# Patient Record
Sex: Female | Born: 1955 | Race: Black or African American | Hispanic: No | Marital: Married
Health system: Southern US, Academic
[De-identification: ages and names within clinical notes are randomized; demographics above are authoritative.]

## PROBLEM LIST (undated history)

## (undated) ENCOUNTER — Ambulatory Visit: Attending: Internal Medicine | Primary: Internal Medicine

## (undated) ENCOUNTER — Ambulatory Visit

## (undated) ENCOUNTER — Telehealth

## (undated) ENCOUNTER — Encounter

## (undated) ENCOUNTER — Ambulatory Visit: Payer: MEDICARE

## (undated) ENCOUNTER — Ambulatory Visit: Payer: Medicare (Managed Care) | Attending: Podiatrist | Primary: Podiatrist

## (undated) ENCOUNTER — Encounter: Attending: Pharmacist | Primary: Pharmacist

## (undated) ENCOUNTER — Encounter: Attending: Ambulatory Care | Primary: Ambulatory Care

## (undated) ENCOUNTER — Other Ambulatory Visit

## (undated) ENCOUNTER — Encounter: Attending: Surgery | Primary: Surgery

## (undated) ENCOUNTER — Ambulatory Visit: Payer: Medicare (Managed Care) | Attending: Family | Primary: Family

## (undated) ENCOUNTER — Ambulatory Visit: Payer: Medicare (Managed Care)

## (undated) ENCOUNTER — Ambulatory Visit
Payer: Medicare (Managed Care) | Attending: Student in an Organized Health Care Education/Training Program | Primary: Student in an Organized Health Care Education/Training Program

## (undated) ENCOUNTER — Ambulatory Visit: Attending: Dermatology | Primary: Dermatology

## (undated) ENCOUNTER — Telehealth: Attending: Surgery | Primary: Surgery

## (undated) ENCOUNTER — Encounter: Attending: Pediatrics | Primary: Pediatrics

## (undated) ENCOUNTER — Encounter: Attending: Diagnostic Radiology | Primary: Diagnostic Radiology

## (undated) ENCOUNTER — Ambulatory Visit: Payer: MEDICARE | Attending: Internal Medicine | Primary: Internal Medicine

## (undated) ENCOUNTER — Ambulatory Visit
Attending: Rehabilitative and Restorative Service Providers" | Primary: Rehabilitative and Restorative Service Providers"

## (undated) ENCOUNTER — Ambulatory Visit: Attending: Pharmacist | Primary: Pharmacist

## (undated) ENCOUNTER — Telehealth: Attending: Dermatology | Primary: Dermatology

## (undated) ENCOUNTER — Encounter: Attending: Internal Medicine | Primary: Internal Medicine

## (undated) ENCOUNTER — Encounter: Attending: Podiatrist | Primary: Podiatrist

## (undated) DIAGNOSIS — E119 Type 2 diabetes mellitus without complications: Secondary | ICD-10-CM

## (undated) MED ORDER — BROMFENAC 0.075 % EYE DROPS: OPHTHALMIC | 0 days

---
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
## 2004-08-22 ENCOUNTER — Emergency Department: Payer: Self-pay | Admitting: Emergency Medicine

## 2009-08-14 ENCOUNTER — Emergency Department: Payer: Self-pay | Admitting: Emergency Medicine

## 2010-04-10 ENCOUNTER — Emergency Department: Payer: Self-pay | Admitting: Emergency Medicine

## 2013-07-01 ENCOUNTER — Emergency Department: Payer: Self-pay | Admitting: Emergency Medicine

## 2016-07-25 ENCOUNTER — Encounter: Payer: Self-pay | Admitting: Emergency Medicine

## 2016-07-25 DIAGNOSIS — R600 Localized edema: Secondary | ICD-10-CM | POA: Insufficient documentation

## 2016-07-25 DIAGNOSIS — Z7984 Long term (current) use of oral hypoglycemic drugs: Secondary | ICD-10-CM | POA: Insufficient documentation

## 2016-07-25 DIAGNOSIS — E119 Type 2 diabetes mellitus without complications: Secondary | ICD-10-CM | POA: Insufficient documentation

## 2016-07-25 NOTE — ED Triage Notes (Signed)
Pt states that she started noticing swelling to her legs bilaterally yesterday. Pt denies hx of CHF or previous cardiac hx. Pt denies pain at his time but does have +1 edema bilaterally to both legs. Pt is ambulatory to triage with NAD noted at this time.

## 2016-07-26 ENCOUNTER — Emergency Department: Payer: Self-pay

## 2016-07-26 ENCOUNTER — Emergency Department
Admission: EM | Admit: 2016-07-26 | Discharge: 2016-07-26 | Disposition: A | Discharge status: Home / Self Care | Payer: Self-pay | Attending: Emergency Medicine | Admitting: Emergency Medicine

## 2016-07-26 DIAGNOSIS — R609 Edema, unspecified: Secondary | ICD-10-CM

## 2016-07-26 HISTORY — DX: Type 2 diabetes mellitus without complications: E11.9

## 2016-07-26 LAB — BASIC METABOLIC PANEL
Anion gap: 9 (ref 5–15)
BUN: 15 mg/dL (ref 6–20)
CHLORIDE: 99 mmol/L — AB (ref 101–111)
CO2: 25 mmol/L (ref 22–32)
CREATININE: 0.54 mg/dL (ref 0.44–1.00)
Calcium: 9.4 mg/dL (ref 8.9–10.3)
Glucose, Bld: 157 mg/dL — ABNORMAL HIGH (ref 65–99)
Potassium: 3.8 mmol/L (ref 3.5–5.1)
SODIUM: 133 mmol/L — AB (ref 135–145)

## 2016-07-26 LAB — CBC
HCT: 31.5 % — ABNORMAL LOW (ref 35.0–47.0)
HEMOGLOBIN: 11 g/dL — AB (ref 12.0–16.0)
MCH: 32.2 pg (ref 26.0–34.0)
MCHC: 34.9 g/dL (ref 32.0–36.0)
MCV: 92.1 fL (ref 80.0–100.0)
PLATELETS: 259 10*3/uL (ref 150–440)
RBC: 3.42 MIL/uL — AB (ref 3.80–5.20)
RDW: 13.4 % (ref 11.5–14.5)
WBC: 5 10*3/uL (ref 3.6–11.0)

## 2016-07-26 LAB — TROPONIN I

## 2016-07-26 LAB — BRAIN NATRIURETIC PEPTIDE: B NATRIURETIC PEPTIDE 5: 21 pg/mL (ref 0.0–100.0)

## 2016-07-26 MED ORDER — OXYCODONE-ACETAMINOPHEN 5-325 MG PO TABS
1.0000 | ORAL_TABLET | Freq: Once | ORAL | Status: AC
  Administered 2016-07-26: 1 via ORAL

## 2016-07-26 MED ORDER — FUROSEMIDE 20 MG PO TABS: 20.0000 mg | ORAL_TABLET | Freq: Every day | ORAL | 0 refills | Status: AC

## 2016-07-26 MED ORDER — FUROSEMIDE 40 MG PO TABS
20.0000 mg | ORAL_TABLET | Freq: Once | ORAL | Status: AC
  Administered 2016-07-26: 20 mg via ORAL

## 2016-07-26 MED ORDER — OXYCODONE-ACETAMINOPHEN 5-325 MG PO TABS
ORAL_TABLET | ORAL | Status: AC
  Filled 2016-07-26: qty 1

## 2016-07-26 MED ORDER — FUROSEMIDE 40 MG PO TABS
ORAL_TABLET | ORAL | Status: DC
Start: 2016-07-26 — End: 2016-07-26
  Filled 2016-07-26: qty 1

## 2016-07-26 NOTE — ED Notes (Signed)
Pt requesting pain medication for her feet. md notified.

## 2016-07-26 NOTE — ED Notes (Signed)
Pt updated on plan of care. Pt verbalizes understanding.  

## 2016-07-26 NOTE — ED Notes (Signed)
Patient transported to Ultrasound 

## 2016-07-26 NOTE — ED Notes (Signed)
Pt states bilateral leg edema that has been intermittent since last week. Pt with 2+ non pitting bilateral pedal to knee edema noted. Pt denies shob, orthopnea, dizziness, pain. Pt states history of diabetes and htn.

## 2016-07-26 NOTE — ED Notes (Signed)
Pt up to restroom to void.  

## 2016-07-26 NOTE — ED Provider Notes (Signed)
[facility] Emergency Department Provider Note   ____________________________________________   First MD Initiated Contact with Patient 07/26/16 [phone]     (approximate)  I have reviewed the triage vital signs and the nursing notes.   HISTORY  Chief Complaint Leg Swelling    HPI Natalie Shepherd is a 61 y.o. female who comes into the hospital today with swelling in her legs and feet. She reports that it didn't last week but it went down on its own. It started again yesterday and gotten bigger and bigger as they went on. The patient reports that she is on her feet a lot and is normal for her. She reports that she does have some pain in her legs at night sometimes but the pain seems to be getting more more uncomfortable. The patient reports that this never occurred like this before. She is not having any shortness of breath or chest pain. The patient does have some dyspnea on exertion but denies any vomiting or abdominal pain. She does have some mild nausea as well. The patient's here for evaluation.   Past Medical History:  Diagnosis Date  . Diabetes mellitus without complication (HCC)     There are no active problems to display for this patient.   History reviewed. No pertinent surgical history.  Prior to Admission medications   Medication Sig Start Date End Date Taking? Authorizing Provider  metFORMIN (GLUCOPHAGE) 500 MG tablet Take by mouth 2 (two) times daily with a meal.   Yes Historical Provider, MD  furosemide (LASIX) 20 MG tablet Take 1 tablet (20 mg total) by mouth daily. 07/26/16 07/26/17  Rebecka Apley, MD    Allergies Patient has no known allergies.  No family history on file.  Social History Social History  Substance Use Topics  . Smoking status: Never Smoker  . Smokeless tobacco: Never Used  . Alcohol use 2.4 oz/week    4 Cans of beer per week    Review of Systems Constitutional: No fever/chills Eyes: No visual  changes. ENT: No sore throat. Cardiovascular: Denies chest pain. Respiratory: Denies shortness of breath. Gastrointestinal: No abdominal pain.  No nausea, no vomiting.  No diarrhea.  No constipation. Genitourinary: Negative for dysuria. Musculoskeletal: Negative for back pain. Skin: Negative for rash. Neurological: Negative for headaches, focal weakness or numbness. Lymph: Leg swellinh 10-point ROS otherwise negative.  ____________________________________________   PHYSICAL EXAM:  VITAL SIGNS: ED Triage Vitals  Enc Vitals Group     BP 07/25/16 2335 139/75     Pulse Rate 07/25/16 2335 (!) 117     Resp 07/25/16 2335 18     Temp 07/25/16 2335 98.6 F (37 C)     Temp Source 07/25/16 2335 Oral     SpO2 07/25/16 2335 98 %     Weight 07/25/16 2335 171 lb (77.6 kg)     Height 07/25/16 2335  (1.702 m)     Head Circumference --      Peak Flow --      Pain Score 07/25/16 2347 0     Pain Loc --      Pain Edu? --      Excl. in [place]? --     Constitutional: Alert and oriented. Well appearing and in no acute distress. Eyes: Conjunctivae are normal. PERRL. EOMI. Head: Atraumatic. Nose: No congestion/rhinnorhea. Mouth/Throat: Mucous membranes are moist.  Oropharynx non-erythematous. Cardiovascular: Normal rate, regular rhythm. Grossly normal heart sounds.  Good peripheral circulation. Respiratory: Normal respiratory effort.  No retractions. Lungs CTAB. Gastrointestinal: Soft and nontender. No distention. Positive bowel sounds Musculoskeletal: No lower extremity tenderness nor edema.   Neurologic:  Normal speech and language.  Skin:  Skin is warm, dry and intact.  Psychiatric: Mood and affect are normal.   ____________________________________________   LABS (all labs ordered are listed, but only abnormal results are displayed)  Labs Reviewed  CBC - Abnormal; Notable for the following:       Result Value   RBC 3.42 (*)    Hemoglobin 11.0 (*)    HCT 31.5 (*)    All other  components within normal limits  BASIC METABOLIC PANEL - Abnormal; Notable for the following:    Sodium 133 (*)    Chloride 99 (*)    Glucose, Bld 157 (*)    All other components within normal limits  TROPONIN I  BRAIN NATRIURETIC PEPTIDE   ____________________________________________  EKG  none ____________________________________________  RADIOLOGY  US venous bilateral lower extremity CXR ____________________________________________   PROCEDURES  Procedure(s) performed: None  Procedures  Critical Care performed: No  ____________________________________________   INITIAL IMPRESSION / ASSESSMENT AND PLAN / ED COURSE  Pertinent labs & imaging results that were available during my care of the patient were reviewed by me and considered in my medical decision making (see chart for details).  This is a 61 year old female who comes into the hospital today with swelling in her lower legs. My biggest concern is new onset heart failure or DVTs. Patient was blood work as well as some x-rays and ultrasound. Blood work was negative patient does not have any edema or cardiomegaly on the chest x-ray and ultrasound shows no DVTs. I did give the patient some Lasix and Percocet. I will discharge the patient to home to have her follow-up with her primary care physician.  Clinical Course as of Jul 26 824  Sat Jul 26, 2016  0424 No active disease.  Mild upper lobe hyperinflation bilaterally. DG Chest 1 View [AW]  0534 No evidence of deep venous thrombosis. US Venous Img Lower Bilateral [AW]    Clinical Course User Index [AW] Rebecka Apley, MD     ____________________________________________   FINAL CLINICAL IMPRESSION(S) / ED DIAGNOSES  Final diagnoses:  Peripheral edema      NEW MEDICATIONS STARTED DURING THIS VISIT:  Discharge Medication List as of 07/26/2016  6:12 AM    START taking these medications   Details  furosemide (LASIX) 20 MG tablet Take 1 tablet (20  mg total) by mouth daily., Starting Sat 07/26/2016, Until Sun 07/26/2017, Print         Note:  This document was prepared using Dragon voice recognition software and may include unintentional dictation errors.    Rebecka Apley, MD 07/26/16 [phone]

## 2016-07-26 NOTE — ED Notes (Signed)
Pt returned from ultrasound

## 2016-09-10 ENCOUNTER — Ambulatory Visit
Admission: RE | Admit: 2016-09-10 | Discharge: 2016-09-10 | Disposition: A | Discharge status: Home / Self Care | Payer: Self-pay | Source: Ambulatory Visit | Attending: Oncology | Admitting: Oncology

## 2016-09-10 ENCOUNTER — Ambulatory Visit: Payer: Self-pay | Attending: Oncology

## 2016-09-10 VITALS — BP 131/84 | HR 93 | Temp 98.6°F | Resp 18 | Ht 62.21 in | Wt 167.8 lb

## 2016-09-10 DIAGNOSIS — Z0000 Encounter for general adult medical examination without abnormal findings: Secondary | ICD-10-CM

## 2016-09-10 NOTE — Progress Notes (Signed)
Subjective:     Patient ID: Natalie Shepherd, female   DOB: 02/05/1956, 61 y.o.   MRN: [id number]  HPI   Review of Systems     Objective:   Physical Exam  Pulmonary/Chest: Right breast exhibits no inverted nipple, no mass, no nipple discharge, no skin change and no tenderness. Left breast exhibits no inverted nipple, no mass, no nipple discharge, no skin change and no tenderness. Breasts are symmetrical.  Right nipple flatter than left       Assessment:     61 year old patient presents for [facility] clinic visit.  No previous mammogram.  Last pap greater than 20 years.  Patient screened, and meets [facility] eligibility.  Patient does not have insurance, Medicare or Medicaid.  Handout given on Affordable Care Act. Instructed patient on breast self-exam using teach back method.  CBE unremarkable. Pelvic exam normal.  Patient lives with daughter, disabled son, and grandson.    Plan:     Sent for bilateral screening mammogram. Specimen collected for pap.

## 2016-09-12 LAB — PAP LB AND HPV HIGH-RISK
HPV, HIGH-RISK: NEGATIVE
PAP Smear Comment: 0

## 2016-09-16 ENCOUNTER — Other Ambulatory Visit: Payer: Self-pay

## 2016-09-16 ENCOUNTER — Other Ambulatory Visit: Payer: Self-pay | Admitting: *Deleted

## 2016-09-16 DIAGNOSIS — N6489 Other specified disorders of breast: Secondary | ICD-10-CM

## 2016-09-22 ENCOUNTER — Ambulatory Visit
Admission: RE | Admit: 2016-09-22 | Discharge: 2016-09-22 | Disposition: A | Discharge status: Home / Self Care | Payer: Self-pay | Source: Ambulatory Visit | Attending: Oncology | Admitting: Oncology

## 2016-09-22 DIAGNOSIS — N6489 Other specified disorders of breast: Secondary | ICD-10-CM

## 2016-09-23 ENCOUNTER — Other Ambulatory Visit: Payer: Self-pay

## 2016-09-23 DIAGNOSIS — N63 Unspecified lump in unspecified breast: Secondary | ICD-10-CM

## 2016-09-29 ENCOUNTER — Ambulatory Visit
Admission: RE | Admit: 2016-09-29 | Discharge: 2016-09-29 | Disposition: A | Discharge status: Home / Self Care | Payer: Self-pay | Source: Ambulatory Visit | Attending: Oncology | Admitting: Oncology

## 2016-09-29 DIAGNOSIS — N63 Unspecified lump in unspecified breast: Secondary | ICD-10-CM

## 2016-09-29 HISTORY — PX: BREAST BIOPSY: SHX20

## 2016-09-30 LAB — SURGICAL PATHOLOGY

## 2016-10-01 NOTE — Progress Notes (Signed)
Patient notified by radiologist of benign breast biopsy results.  Mailed notification of negative/negative pap results. Copy to [facility].

## 2016-10-24 MED ORDER — BETAMETHASONE DIPROPIONATE 0.05 % TOPICAL OINTMENT
Freq: Two times a day (BID) | TOPICAL | 3 refills | 0.00000 days | Status: CP
Start: 2016-10-24 — End: 2016-12-05

## 2016-11-18 ENCOUNTER — Ambulatory Visit
Admission: RE | Admit: 2016-11-18 | Discharge: 2016-11-18 | Disposition: A | Discharge status: Home / Self Care | Attending: Ambulatory Care | Admitting: Ambulatory Care

## 2016-11-18 DIAGNOSIS — L4050 Arthropathic psoriasis, unspecified: Principal | ICD-10-CM

## 2016-11-25 ENCOUNTER — Ambulatory Visit: Admission: RE | Admit: 2016-11-25 | Discharge: 2016-11-25 | Admitting: Internal Medicine

## 2016-11-25 DIAGNOSIS — R609 Edema, unspecified: Principal | ICD-10-CM

## 2016-11-25 MED ORDER — FUROSEMIDE 40 MG TABLET
ORAL_TABLET | Freq: Every day | ORAL | 11 refills | 0 days | Status: CP
Start: 2016-11-25 — End: 2018-02-18

## 2016-12-05 ENCOUNTER — Ambulatory Visit: Admission: RE | Admit: 2016-12-05 | Discharge: 2016-12-05 | Admitting: Dermatology

## 2016-12-05 DIAGNOSIS — L409 Psoriasis, unspecified: Principal | ICD-10-CM

## 2016-12-05 MED ORDER — FLUOCINONIDE 0.05 % TOPICAL OINTMENT
Freq: Two times a day (BID) | TOPICAL | 2 refills | 0 days | Status: CP
Start: 2016-12-05 — End: 2016-12-10

## 2016-12-10 MED ORDER — FLUOCINONIDE 0.05 % TOPICAL OINTMENT
Freq: Two times a day (BID) | TOPICAL | 2 refills | 0.00000 days | Status: CP
Start: 2016-12-10 — End: 2017-04-29

## 2016-12-11 ENCOUNTER — Ambulatory Visit: Admission: RE | Admit: 2016-12-11 | Discharge: 2016-12-11

## 2016-12-11 DIAGNOSIS — R609 Edema, unspecified: Principal | ICD-10-CM

## 2016-12-17 ENCOUNTER — Ambulatory Visit
Admission: RE | Admit: 2016-12-17 | Discharge: 2016-12-17 | Disposition: A | Discharge status: Home / Self Care | Payer: Disability Insurance | Source: Ambulatory Visit | Attending: Obstetrics and Gynecology | Admitting: Obstetrics and Gynecology

## 2016-12-17 ENCOUNTER — Other Ambulatory Visit: Payer: Self-pay | Admitting: Obstetrics and Gynecology

## 2016-12-17 DIAGNOSIS — M25879 Other specified joint disorders, unspecified ankle and foot: Secondary | ICD-10-CM | POA: Diagnosis present

## 2016-12-17 DIAGNOSIS — Z7409 Other reduced mobility: Secondary | ICD-10-CM

## 2016-12-17 DIAGNOSIS — E119 Type 2 diabetes mellitus without complications: Secondary | ICD-10-CM | POA: Insufficient documentation

## 2016-12-17 DIAGNOSIS — R269 Unspecified abnormalities of gait and mobility: Secondary | ICD-10-CM | POA: Diagnosis not present

## 2016-12-17 DIAGNOSIS — M7731 Calcaneal spur, right foot: Secondary | ICD-10-CM | POA: Diagnosis not present

## 2016-12-22 ENCOUNTER — Ambulatory Visit
Admission: RE | Admit: 2016-12-22 | Discharge: 2016-12-22 | Disposition: A | Discharge status: Home / Self Care | Attending: Medical

## 2016-12-22 DIAGNOSIS — L4050 Arthropathic psoriasis, unspecified: Principal | ICD-10-CM

## 2017-01-13 ENCOUNTER — Ambulatory Visit: Admission: RE | Admit: 2017-01-13 | Discharge: 2017-01-13 | Disposition: A | Discharge status: Home / Self Care

## 2017-01-13 DIAGNOSIS — R609 Edema, unspecified: Principal | ICD-10-CM

## 2017-01-19 ENCOUNTER — Ambulatory Visit: Admission: RE | Admit: 2017-01-19 | Discharge: 2017-01-19

## 2017-01-19 DIAGNOSIS — L403 Pustulosis palmaris et plantaris: Principal | ICD-10-CM

## 2017-01-19 MED ORDER — BETAMETHASONE, AUGMENTED 0.05 % TOPICAL OINTMENT
Freq: Two times a day (BID) | TOPICAL | 6 refills | 0 days | Status: CP
Start: 2017-01-19 — End: 2017-04-29

## 2017-04-22 ENCOUNTER — Ambulatory Visit: Admit: 2017-04-22 | Discharge: 2017-04-23 | Attending: Internal Medicine | Primary: Internal Medicine

## 2017-04-22 DIAGNOSIS — E118 Type 2 diabetes mellitus with unspecified complications: Secondary | ICD-10-CM

## 2017-04-22 DIAGNOSIS — R609 Edema, unspecified: Principal | ICD-10-CM

## 2017-04-22 DIAGNOSIS — R079 Chest pain, unspecified: Secondary | ICD-10-CM

## 2017-04-29 ENCOUNTER — Ambulatory Visit: Admit: 2017-04-29 | Discharge: 2017-04-30 | Attending: Dermatology | Primary: Dermatology

## 2017-04-29 DIAGNOSIS — L409 Psoriasis, unspecified: Principal | ICD-10-CM

## 2017-04-29 MED ORDER — TRIAMCINOLONE ACETONIDE 0.1 % TOPICAL OINTMENT
Freq: Two times a day (BID) | TOPICAL | 5 refills | 0.00000 days | Status: CP
Start: 2017-04-29 — End: 2017-04-29

## 2017-04-29 MED ORDER — TRIAMCINOLONE ACETONIDE 0.1 % TOPICAL OINTMENT: g | Freq: Two times a day (BID) | 5 refills | 0 days | Status: AC

## 2017-04-29 MED ORDER — ADALIMUMAB 40 MG/0.8 ML SUBCUTANEOUS PEN KIT
SUBCUTANEOUS | 2 refills | 0.00000 days | Status: CP
Start: 2017-04-29 — End: 2017-04-29

## 2017-04-29 MED ORDER — ADALIMUMAB 40 MG/0.8 ML SUBCUTANEOUS PEN KIT: 40 mg | each | 2 refills | 0 days | Status: AC

## 2017-05-01 ENCOUNTER — Ambulatory Visit: Admit: 2017-05-01 | Discharge: 2017-05-02

## 2017-05-01 DIAGNOSIS — E118 Type 2 diabetes mellitus with unspecified complications: Secondary | ICD-10-CM

## 2017-05-01 DIAGNOSIS — R609 Edema, unspecified: Secondary | ICD-10-CM

## 2017-05-01 DIAGNOSIS — R079 Chest pain, unspecified: Principal | ICD-10-CM

## 2017-05-27 ENCOUNTER — Ambulatory Visit: Admit: 2017-05-27 | Discharge: 2017-05-28 | Attending: Dermatology | Primary: Dermatology

## 2017-05-27 DIAGNOSIS — L409 Psoriasis, unspecified: Principal | ICD-10-CM

## 2017-08-20 ENCOUNTER — Other Ambulatory Visit: Payer: Self-pay | Admitting: Family Medicine

## 2017-08-20 DIAGNOSIS — Z1231 Encounter for screening mammogram for malignant neoplasm of breast: Secondary | ICD-10-CM

## 2017-10-30 ENCOUNTER — Ambulatory Visit: Admit: 2017-10-30 | Discharge: 2017-10-31 | Attending: Internal Medicine | Primary: Internal Medicine

## 2017-10-30 DIAGNOSIS — R600 Localized edema: Secondary | ICD-10-CM

## 2017-10-30 DIAGNOSIS — R079 Chest pain, unspecified: Principal | ICD-10-CM

## 2017-10-30 DIAGNOSIS — E118 Type 2 diabetes mellitus with unspecified complications: Secondary | ICD-10-CM

## 2017-11-05 ENCOUNTER — Ambulatory Visit: Admit: 2017-11-05 | Discharge: 2017-11-18

## 2017-11-05 DIAGNOSIS — E118 Type 2 diabetes mellitus with unspecified complications: Secondary | ICD-10-CM

## 2017-11-05 DIAGNOSIS — R079 Chest pain, unspecified: Principal | ICD-10-CM

## 2017-11-30 ENCOUNTER — Ambulatory Visit: Admit: 2017-11-30 | Discharge: 2017-12-01 | Attending: Internal Medicine | Primary: Internal Medicine

## 2017-11-30 DIAGNOSIS — R600 Localized edema: Secondary | ICD-10-CM

## 2017-11-30 DIAGNOSIS — R079 Chest pain, unspecified: Principal | ICD-10-CM

## 2017-12-02 ENCOUNTER — Ambulatory Visit: Admit: 2017-12-02 | Discharge: 2017-12-03 | Attending: Dermatology | Primary: Dermatology

## 2017-12-02 DIAGNOSIS — Z79899 Other long term (current) drug therapy: Principal | ICD-10-CM

## 2017-12-02 MED ORDER — ADALIMUMAB 40 MG/0.8 ML SUBCUTANEOUS PEN KIT
SUBCUTANEOUS | 6 refills | 0 days | Status: CP
Start: 2017-12-02 — End: 2018-11-03

## 2017-12-07 ENCOUNTER — Ambulatory Visit
Admission: RE | Admit: 2017-12-07 | Discharge: 2017-12-07 | Disposition: A | Discharge status: Home / Self Care | Payer: Self-pay | Source: Ambulatory Visit | Attending: Family Medicine | Admitting: Family Medicine

## 2017-12-07 DIAGNOSIS — Z1231 Encounter for screening mammogram for malignant neoplasm of breast: Secondary | ICD-10-CM | POA: Insufficient documentation

## 2017-12-17 ENCOUNTER — Ambulatory Visit: Admit: 2017-12-17 | Discharge: 2017-12-18

## 2017-12-17 DIAGNOSIS — E119 Type 2 diabetes mellitus without complications: Secondary | ICD-10-CM

## 2017-12-17 DIAGNOSIS — M170 Bilateral primary osteoarthritis of knee: Secondary | ICD-10-CM

## 2017-12-17 DIAGNOSIS — L4050 Arthropathic psoriasis, unspecified: Principal | ICD-10-CM

## 2017-12-17 DIAGNOSIS — R600 Localized edema: Secondary | ICD-10-CM

## 2017-12-23 ENCOUNTER — Ambulatory Visit: Admit: 2017-12-23 | Discharge: 2017-12-24

## 2017-12-23 DIAGNOSIS — L4050 Arthropathic psoriasis, unspecified: Principal | ICD-10-CM

## 2018-01-20 ENCOUNTER — Ambulatory Visit
Admit: 2018-01-20 | Discharge: 2018-02-18 | Attending: Rehabilitative and Restorative Service Providers" | Primary: Rehabilitative and Restorative Service Providers"

## 2018-01-20 DIAGNOSIS — M79605 Pain in left leg: Secondary | ICD-10-CM

## 2018-01-20 DIAGNOSIS — M170 Bilateral primary osteoarthritis of knee: Principal | ICD-10-CM

## 2018-01-20 DIAGNOSIS — M79604 Pain in right leg: Secondary | ICD-10-CM

## 2018-02-03 DIAGNOSIS — M79604 Pain in right leg: Secondary | ICD-10-CM

## 2018-02-03 DIAGNOSIS — M170 Bilateral primary osteoarthritis of knee: Principal | ICD-10-CM

## 2018-02-03 DIAGNOSIS — M79605 Pain in left leg: Secondary | ICD-10-CM

## 2018-02-18 MED ORDER — FUROSEMIDE 40 MG TABLET
ORAL_TABLET | 11 refills | 0 days | Status: CP
Start: 2018-02-18 — End: 2018-04-01

## 2018-04-01 ENCOUNTER — Ambulatory Visit: Admit: 2018-04-01 | Discharge: 2018-04-02

## 2018-04-01 DIAGNOSIS — R600 Localized edema: Principal | ICD-10-CM

## 2018-04-01 DIAGNOSIS — R079 Chest pain, unspecified: Secondary | ICD-10-CM

## 2018-04-01 MED ORDER — FUROSEMIDE 40 MG TABLET
ORAL_TABLET | Freq: Every day | ORAL | 11 refills | 0 days | Status: CP
Start: 2018-04-01 — End: ?

## 2018-07-06 DIAGNOSIS — L4050 Arthropathic psoriasis, unspecified: Principal | ICD-10-CM

## 2018-07-06 MED ORDER — HUMIRA PEN CITRATE FREE 40 MG/0.4 ML
SUBCUTANEOUS | 3 refills | 0 days | Status: CP
Start: 2018-07-06 — End: 2018-07-06

## 2018-07-06 MED ORDER — HUMIRA PEN CITRATE FREE 40 MG/0.4 ML: 40 mg | each | 3 refills | 0 days | Status: AC

## 2018-07-29 ENCOUNTER — Institutional Professional Consult (permissible substitution): Admit: 2018-07-29 | Discharge: 2018-07-30 | Attending: Dermatology | Primary: Dermatology

## 2018-07-29 DIAGNOSIS — I872 Venous insufficiency (chronic) (peripheral): Secondary | ICD-10-CM

## 2018-07-29 DIAGNOSIS — Z79899 Other long term (current) drug therapy: Secondary | ICD-10-CM

## 2018-07-29 DIAGNOSIS — L409 Psoriasis, unspecified: Principal | ICD-10-CM

## 2018-07-29 MED ORDER — TRIAMCINOLONE ACETONIDE 0.1 % TOPICAL OINTMENT
Freq: Two times a day (BID) | TOPICAL | 3 refills | 0.00000 days | Status: CP
Start: 2018-07-29 — End: 2019-07-29

## 2018-09-30 ENCOUNTER — Institutional Professional Consult (permissible substitution): Admit: 2018-09-30 | Discharge: 2018-10-01 | Attending: Internal Medicine | Primary: Internal Medicine

## 2018-09-30 DIAGNOSIS — R42 Dizziness and giddiness: Secondary | ICD-10-CM

## 2018-09-30 DIAGNOSIS — R600 Localized edema: Secondary | ICD-10-CM

## 2018-09-30 DIAGNOSIS — R079 Chest pain, unspecified: Principal | ICD-10-CM

## 2018-10-04 ENCOUNTER — Ambulatory Visit: Admit: 2018-10-04 | Discharge: 2018-10-05

## 2018-10-04 DIAGNOSIS — R600 Localized edema: Principal | ICD-10-CM

## 2018-11-03 ENCOUNTER — Other Ambulatory Visit: Payer: Self-pay | Admitting: Family Medicine

## 2018-11-04 ENCOUNTER — Institutional Professional Consult (permissible substitution): Admit: 2018-11-04 | Discharge: 2018-11-05

## 2018-11-04 DIAGNOSIS — L4050 Arthropathic psoriasis, unspecified: Principal | ICD-10-CM

## 2018-11-04 DIAGNOSIS — R252 Cramp and spasm: Secondary | ICD-10-CM

## 2018-11-04 DIAGNOSIS — E1169 Type 2 diabetes mellitus with other specified complication: Secondary | ICD-10-CM

## 2018-11-04 DIAGNOSIS — M792 Neuralgia and neuritis, unspecified: Secondary | ICD-10-CM

## 2018-11-15 ENCOUNTER — Ambulatory Visit: Admit: 2018-11-15 | Discharge: 2018-11-16

## 2018-11-15 DIAGNOSIS — M792 Neuralgia and neuritis, unspecified: Principal | ICD-10-CM

## 2018-11-23 MED ORDER — HUMIRA PEN CITRATE FREE 40 MG/0.4 ML
SUBCUTANEOUS | 3 refills | 84 days | Status: CP
Start: 2018-11-23 — End: ?

## 2019-03-29 ENCOUNTER — Institutional Professional Consult (permissible substitution): Admit: 2019-03-29 | Discharge: 2019-03-30

## 2019-04-26 ENCOUNTER — Ambulatory Visit: Admit: 2019-04-26 | Discharge: 2019-04-27 | Attending: Internal Medicine | Primary: Internal Medicine

## 2019-04-26 DIAGNOSIS — R109 Unspecified abdominal pain: Principal | ICD-10-CM

## 2019-04-26 DIAGNOSIS — R609 Edema, unspecified: Principal | ICD-10-CM

## 2019-04-26 DIAGNOSIS — R079 Chest pain, unspecified: Principal | ICD-10-CM

## 2019-09-20 ENCOUNTER — Other Ambulatory Visit: Payer: Self-pay

## 2019-09-20 ENCOUNTER — Encounter: Payer: Self-pay | Admitting: Emergency Medicine

## 2019-09-20 ENCOUNTER — Emergency Department: Payer: Self-pay

## 2019-09-20 DIAGNOSIS — E119 Type 2 diabetes mellitus without complications: Secondary | ICD-10-CM | POA: Insufficient documentation

## 2019-09-20 DIAGNOSIS — J189 Pneumonia, unspecified organism: Secondary | ICD-10-CM | POA: Insufficient documentation

## 2019-09-20 DIAGNOSIS — R0602 Shortness of breath: Secondary | ICD-10-CM | POA: Insufficient documentation

## 2019-09-20 DIAGNOSIS — Z7984 Long term (current) use of oral hypoglycemic drugs: Secondary | ICD-10-CM | POA: Insufficient documentation

## 2019-09-20 DIAGNOSIS — Z79899 Other long term (current) drug therapy: Secondary | ICD-10-CM | POA: Insufficient documentation

## 2019-09-20 LAB — CBC
HCT: 34.7 % — ABNORMAL LOW (ref 36.0–46.0)
Hemoglobin: 11.1 g/dL — ABNORMAL LOW (ref 12.0–15.0)
MCH: 29.1 pg (ref 26.0–34.0)
MCHC: 32 g/dL (ref 30.0–36.0)
MCV: 91.1 fL (ref 80.0–100.0)
Platelets: 437 10*3/uL — ABNORMAL HIGH (ref 150–400)
RBC: 3.81 MIL/uL — ABNORMAL LOW (ref 3.87–5.11)
RDW: 13 % (ref 11.5–15.5)
WBC: 7.9 10*3/uL (ref 4.0–10.5)
nRBC: 0 % (ref 0.0–0.2)

## 2019-09-20 LAB — TROPONIN I (HIGH SENSITIVITY): Troponin I (High Sensitivity): 3 ng/L (ref <18)

## 2019-09-20 LAB — BASIC METABOLIC PANEL
Anion gap: 10 (ref 5–15)
BUN: 17 mg/dL (ref 8–23)
CO2: 29 mmol/L (ref 22–32)
Calcium: 9.1 mg/dL (ref 8.9–10.3)
Chloride: 96 mmol/L — ABNORMAL LOW (ref 98–111)
Creatinine, Ser: 0.97 mg/dL (ref 0.44–1.00)
GFR calc Af Amer: >60 mL/min (ref >60)
GFR calc non Af Amer: >60 mL/min (ref >60)
Glucose, Bld: 194 mg/dL — ABNORMAL HIGH (ref 70–99)
Potassium: 4.7 mmol/L (ref 3.5–5.1)
Sodium: 135 mmol/L (ref 135–145)

## 2019-09-20 NOTE — ED Triage Notes (Signed)
Pt arrives to ED voia POV from home with c/o chest pain and cough. Pt reports CP x2 weeks intermittently; cough since April. Pt reports CP is centralized without radiation. Pt denies diaphoresis; no N/V/D or fever. Pt denies previous cardiac h/x. Pt is A&O, in NAD; RR even, regular, and unlabored.

## 2019-09-21 ENCOUNTER — Other Ambulatory Visit: Payer: Self-pay

## 2019-09-21 ENCOUNTER — Emergency Department: Payer: Self-pay

## 2019-09-21 ENCOUNTER — Emergency Department
Admission: EM | Admit: 2019-09-21 | Discharge: 2019-09-21 | Disposition: A | Discharge status: Home / Self Care | Payer: Self-pay | Attending: Emergency Medicine | Admitting: Emergency Medicine

## 2019-09-21 DIAGNOSIS — J189 Pneumonia, unspecified organism: Secondary | ICD-10-CM

## 2019-09-21 LAB — BRAIN NATRIURETIC PEPTIDE: B Natriuretic Peptide: 34.8 pg/mL (ref 0.0–100.0)

## 2019-09-21 LAB — FIBRIN DERIVATIVES D-DIMER (ARMC ONLY): Fibrin derivatives D-dimer (ARMC): 908.11 ng/mL (FEU) — ABNORMAL HIGH (ref 0.00–499.00)

## 2019-09-21 LAB — PROCALCITONIN: Procalcitonin: <0.1 ng/mL

## 2019-09-21 LAB — TROPONIN I (HIGH SENSITIVITY): Troponin I (High Sensitivity): 3 ng/L (ref <18)

## 2019-09-21 MED ORDER — AZITHROMYCIN 250 MG PO TABS: ORAL_TABLET | ORAL | 0 refills | Status: AC

## 2019-09-21 MED ORDER — IPRATROPIUM-ALBUTEROL 0.5-2.5 (3) MG/3ML IN SOLN
3.0000 mL | Freq: Once | RESPIRATORY_TRACT | Status: AC
  Administered 2019-09-21: 3 mL via RESPIRATORY_TRACT
  Filled 2019-09-21: qty 3

## 2019-09-21 MED ORDER — AZITHROMYCIN 500 MG PO TABS
500.0000 mg | ORAL_TABLET | Freq: Once | ORAL | Status: AC
  Administered 2019-09-21: 500 mg via ORAL
  Filled 2019-09-21: qty 1

## 2019-09-21 MED ORDER — AMOXICILLIN-POT CLAVULANATE 875-125 MG PO TABS
1.0000 | ORAL_TABLET | Freq: Two times a day (BID) | ORAL | 0 refills | Status: AC
Start: 2019-09-21 — End: 2019-10-01

## 2019-09-21 MED ORDER — IOHEXOL 350 MG/ML SOLN
75.0000 mL | Freq: Once | INTRAVENOUS | Status: AC | PRN
  Administered 2019-09-21: 75 mL via INTRAVENOUS

## 2019-09-21 MED ORDER — AMOXICILLIN-POT CLAVULANATE 875-125 MG PO TABS
1.0000 | ORAL_TABLET | Freq: Once | ORAL | Status: AC
  Administered 2019-09-21: 1 via ORAL
  Filled 2019-09-21: qty 1

## 2019-09-21 NOTE — ED Notes (Signed)
ED Provider at bedside. 

## 2019-09-21 NOTE — ED Provider Notes (Signed)
[facility] Emergency Department Provider Note  ____________________________________________  Time seen: Approximately 1:42 AM  I have reviewed the triage vital signs and the nursing notes.   HISTORY  Chief Complaint Chest Pain and Cough   HPI Natalie Shepherd is a 64 y.o. female with a history of type 2 diabetes and psoriasis who presents for evaluation of cough and chest pain.   Patient has had a cough for 2 months that is productive of yellow sputum.  The cough has been constant and persistent.  Over the last 2 weeks she has been having to remittent chest pain that she describes as sharp, diffuse, mostly when she coughs.  No fever, no chills, no loss of taste or smell.  When she coughs she does have mild shortness of breath.  No orthopnea.  Patient does have a history of swelling of her lower extremities for which she had been prescribed Lasix however has not been taking it for a while.  No changes in swelling of her legs, no changes in her weight, no history of CHF, COPD, asthma, or smoking.  No personal or family history of blood clots, no recent travel immobilization, no leg pain, no hemoptysis or exogenous hormones.  No Covid vaccination or exposure.  Patient reports that she presented to the hospital today because a family member who is a nurse told her to come in to get checked out.  Past Medical History:  Diagnosis Date  . Diabetes mellitus without complication ([place])     There are no problems to display for this patient.   Past Surgical History:  Procedure Laterality Date  . BREAST BIOPSY Left 09/29/2016   [place] bx/clip- neg    Prior to Admission medications   Medication Sig Start Date End Date Taking? Authorizing Provider  amoxicillin-clavulanate (AUGMENTIN) 875-125 MG tablet Take 1 tablet by mouth 2 (two) times daily for 10 days. 09/21/19 10/01/19  Rudene Re, MD  azithromycin [facility]) 250 MG tablet Take 1 a day for 4 days 09/21/19    Alfred Levins, [place], MD  furosemide (LASIX) 20 MG tablet Take 1 tablet (20 mg total) by mouth daily. 07/26/16 07/26/17  Loney Hering, MD  metFORMIN (GLUCOPHAGE) 500 MG tablet Take by mouth 2 (two) times daily with a meal.    [provider]    Allergies Patient has no known allergies.  Family History  Problem Relation Age of Onset  . Breast cancer Neg Hx     Social History Social History   Tobacco Use  . Smoking status: Never Smoker  . Smokeless tobacco: Never Used  Substance Use Topics  . Alcohol use: Yes    Alcohol/week: 4.0 standard drinks    Types: 4 Cans of beer per week  . Drug use: No    Review of Systems  Constitutional: Negative for fever. Eyes: Negative for visual changes. ENT: Negative for sore throat. Neck: No neck pain  Cardiovascular: + chest pain. Respiratory: Negative for shortness of breath. + cough Gastrointestinal: Negative for abdominal pain, vomiting or diarrhea. Genitourinary: Negative for dysuria. Musculoskeletal: Negative for back pain. Skin: Negative for rash. Neurological: Negative for headaches, weakness or numbness. Psych: No SI or HI  ____________________________________________   PHYSICAL EXAM:  VITAL SIGNS: ED Triage Vitals  Enc Vitals Group     BP 09/20/19 2106 (!) 141/75     Pulse Rate 09/20/19 2106 (!) 102     Resp 09/20/19 2106 18     Temp 09/20/19 2106 98.7 F (37.1  C)     Temp Source 09/20/19 2106 Oral     SpO2 09/20/19 2106 98 %     Weight 09/20/19 2104 165 lb (74.8 kg)     Height 09/20/19 2104 _0  (1.626 m)     Head Circumference --      Peak Flow --      Pain Score 09/20/19 2114 8     Pain Loc --      Pain Edu? --      Excl. in [place]? --     Constitutional: Alert and oriented. Well appearing and in no apparent distress.  Actively coughing HEENT:      Head: Normocephalic and atraumatic.         Eyes: Conjunctivae are normal. Sclera is non-icteric.       Mouth/Throat: Mucous membranes are moist.        Neck: Supple with no signs of meningismus. Cardiovascular: Tachycardic with regular rhythm and no murmurs. Respiratory: Normal work of breathing, normal sats, patient has faint crackles bilaterally and decreased air movement.  Gastrointestinal: Soft, non tender, and non distended. Musculoskeletal: Nontender with normal range of motion in all extremities. No edema, cyanosis, or erythema of extremities. Neurologic: Normal speech and language. Face is symmetric. Moving all extremities. No gross focal neurologic deficits are appreciated. Skin: Skin is warm, dry and intact. No rash noted. Psychiatric: Mood and affect are normal. Speech and behavior are normal.  ____________________________________________   LABS (all labs ordered are listed, but only abnormal results are displayed)  Labs Reviewed  BASIC METABOLIC PANEL - Abnormal; Notable for the following components:      Result Value   Chloride 96 (*)    Glucose, Bld 194 (*)    All other components within normal limits  CBC - Abnormal; Notable for the following components:   RBC 3.81 (*)    Hemoglobin 11.1 (*)    HCT 34.7 (*)    Platelets 437 (*)    All other components within normal limits  FIBRIN DERIVATIVES D-DIMER ([facility] ONLY) - Abnormal; Notable for the following components:   Fibrin derivatives D-dimer ([facility]) 908.11 (*)    All other components within normal limits  BRAIN NATRIURETIC PEPTIDE  PROCALCITONIN  TROPONIN I (HIGH SENSITIVITY)  TROPONIN I (HIGH SENSITIVITY)   ____________________________________________  EKG  ED ECG REPORT I, Rudene Re, the attending physician, personally viewed and interpreted this ECG.  Sinus tachycardia, rate of 100, normal intervals, normal axis, no ST elevations or depressions.  Otherwise normal EKG. ____________________________________________  RADIOLOGY  I have personally reviewed the images performed during this visit and I agree with the Radiologist's  read.   Interpretation by Radiologist:  DG Chest 2 View  Result Date: 09/20/2019 CLINICAL DATA:  Chest pain and cough. EXAM: CHEST - 2 VIEW COMPARISON:  07/26/2016 FINDINGS: The cardiomediastinal contours are normal. Streaky bibasilar opacities, right greater than left. Pulmonary vasculature is normal. No pleural effusion or pneumothorax. No acute osseous abnormalities are seen. IMPRESSION: Streaky bibasilar opacities, right greater than left, may represent atelectasis or pneumonia in the setting of cough. Electronically Signed   By: Keith Rake M.D.   On: 09/20/2019 21:37   CT Angio Chest PE W and/or Wo Contrast  Result Date: 09/21/2019 CLINICAL DATA:  Chest pain and cough EXAM: CT ANGIOGRAPHY CHEST WITH CONTRAST TECHNIQUE: Multidetector CT imaging of the chest was performed using the standard protocol during bolus administration of intravenous contrast. Multiplanar CT image reconstructions and MIPs were obtained to evaluate the vascular  anatomy. CONTRAST:  41m OMNIPAQUE IOHEXOL 350 MG/ML SOLN COMPARISON:  Chest radiograph 09/20/2018 FINDINGS: Cardiovascular: Satisfactory opacification of the pulmonary arteries. No central or lobar pulmonary arterial filling defects are identified. Evaluation for more distal segmental and subsegmental pulmonary emboli limited by respiratory motion. Central pulmonary arteries are upper limits normal caliber. Normal heart size. No pericardial effusion. Three-vessel coronary artery calcifications are noted. Few calcifications on the aortic leaflets. Minimal plaque in the aortic arch and great vessels with a shared origin of the brachiocephalic and left common carotid artery. No acute luminal abnormality or periaortic stranding or hemorrhage. Mediastinum/Nodes: No mediastinal fluid or gas. No concerning thyroid nodules. No acute abnormality of the trachea or esophagus. No worrisome mediastinal, hilar or axillary adenopathy. Few subcentimeter low-attenuation lymph nodes in  the mediastinum and hila are favored to be reactive. Lungs/Pleura: There are areas of mixed consolidative, ground-glass and tree-in-bud opacity in the lung bases which appears to be centered upon diffusely thickened and fluid-filled lower lobe airways bilaterally. No pneumothorax or visible effusion. No convincing features of edema. No suspicious pulmonary nodules or masses. Upper Abdomen: No acute abnormalities present in the visualized portions of the upper abdomen. Musculoskeletal: Few small lucent foci present in the right third and sixth ribs laterally (8/5, 7). No other acute or suspicious osseous lesions. No worrisome chest wall masses. Review of the MIP images confirms the above findings. IMPRESSION: 1. No central or lobar pulmonary arterial filling defects are identified to suggest pulmonary embolism. Evaluation for more distal segmental and subsegmental pulmonary emboli limited by respiratory motion. 2. Basilar opacities centered upon thickened and fluid-filled airways in the lower lungs, likely reflecting cute infectious or inflammatory process including potential aspiration pneumonitis. 3. Few small lucent foci in the right third and sixth ribs laterally. Appearance is nonspecific in the absence of known malignancy. Correlate with patient history. 4. Aortic Atherosclerosis (ICD10-I70.0). Electronically Signed   By: PLovena LeM.D.   On: 09/21/2019 02:44     ____________________________________________   PROCEDURES  Procedure(s) performed:yes .1-3 Lead EKG Interpretation Performed by: VRudene Re MD Authorized by: VRudene Re MD     Interpretation: normal     ECG rate assessment: normal     Rhythm: sinus rhythm     Ectopy: none     Critical Care performed: yes  CRITICAL CARE Performed by: CRudene Re ?  Total critical care time: 35 min  Critical care time was exclusive of separately billable procedures and treating other patients.  Critical care was  necessary to treat or prevent imminent or life-threatening deterioration.  Critical care was time spent personally by me on the following activities: development of treatment plan with patient and/or surrogate as well as nursing, discussions with consultants, evaluation of patient's response to treatment, examination of patient, obtaining history from patient or surrogate, ordering and performing treatments and interventions, ordering and review of laboratory studies, ordering and review of radiographic studies, pulse oximetry and re-evaluation of patient's condition.  ____________________________________________   INITIAL IMPRESSION / ASSESSMENT AND PLAN / ED COURSE   64y.o. female with a history of type 2 diabetes and psoriasis who presents for evaluation of productive cough x 3 months and intermittent chest pain x 2 weeks.  Patient is actively coughing but in no respiratory distress, normal work of breathing normal sats, she has bilateral faint crackles and decreased air movement bilaterally.  She looks euvolemic otherwise with no pitting edema or elevated JVD.  EKG showing sinus tachycardia with no other acute abnormalities.  No prior for comparison.  Patient was placed on telemetry for close monitoring.  Old medical records reviewed.  Chest x-ray visualized by me showing right lower lobe opacity, confirmed by radiology.  Patient is afebrile and has no leukocytosis and an indwelling cough of 3 months.  Could be pneumonia possibly atypical, procalcitonin pending.  BNP is normal.  High-sensitivity troponin x2 -.  D-dimer was done to rule out a PE and is pending.  Will give a duoneb and reassess  _________________________ 3:17 AM on 09/21/2019 -----------------------------------------  Elevated D-dimer at 908 therefore patient was sent for CT angio of the chest.  Negative for PE but does confirm PNA.  CT was visualized and interpreted by me and confirmed by radiology.  CTA incidental finding of  lucencies on right third and sixth ribs.  Discussed these findings with patient and recommended follow-up with PCP for further evaluation of possible malignancy/multiple myeloma.  Patient reports no change in her symptoms after a DuoNeb therefore we will not provide her with a prescription for albuterol inhaler.  She was started on azithromycin and Augmentin here and discharged home with prescription for both.  Recommended close follow-up with PCP and discussed my standard return precautions.     _____________________________________________ Please note:  Patient was evaluated in Emergency Department today for the symptoms described in the history of present illness. Patient was evaluated in the context of the global COVID-19 pandemic, which necessitated consideration that the patient might be at risk for infection with the SARS-CoV-2 virus that causes COVID-19. Institutional protocols and algorithms that pertain to the evaluation of patients at risk for COVID-19 are in a state of rapid change based on information released by regulatory bodies including the CDC and federal and state organizations. These policies and algorithms were followed during the patient's care in the ED.  Some ED evaluations and interventions may be delayed as a result of limited staffing during the pandemic.   [place] Controlled Substance Database was reviewed by me. ____________________________________________   FINAL CLINICAL IMPRESSION(S) / ED DIAGNOSES   Final diagnoses:  Community acquired pneumonia, unspecified laterality      NEW MEDICATIONS STARTED DURING THIS VISIT:  ED Discharge Orders         Ordered    azithromycin (ZITHROMAX) 250 MG tablet     09/21/19 0312    amoxicillin-clavulanate (AUGMENTIN) 875-125 MG tablet  2 times daily     09/21/19 2224           Note:  This document was prepared using Dragon voice recognition software and may include unintentional dictation errors.    Alfred Levins, [place],  MD 09/21/19 [phone]

## 2019-09-21 NOTE — Discharge Instructions (Addendum)
You were seen in the Emergency Department (ED) today and diagnosed with pneumonia. Pneumonia is an infection of the lungs. Most cases are caused by infections from bacteria or viruses.   Pneumonia may be mild or very severe. If it is caused by bacteria, you will be treated with antibiotics. It may take a few weeks to a few months to recover fully from pneumonia, depending on how sick you were and whether your overall health is good.   Take the antibiotics prescribed fully.  Azithromycin is once a day for 4 days.  Augmentin is twice a day for 10 days.  As explained to you, there were some abnormal findings on your right third and sixth ribs.  This finding can sometimes be seen with malignancy or multiple myeloma.  Make sure to follow-up with your doctor for further evaluation of this finding.  Follow-up with your doctor in 1-2 days for further evaluation.  When should you call for help?  Call 911 anytime you think you may need emergency care. For example, call if:  You have difficulty breathing or chest pain  Call your doctor now or seek immediate medical care if:  You cough up dark brown or bloody mucus (sputum).  You have new or worse trouble breathing.  You are dizzy or lightheaded, or you feel like you may faint.  Watch closely for changes in your health, and be sure to contact your doctor if:  You have a new or higher fever.  You are coughing more deeply or more often.  You are not getting better after 2 days (48 hours).  You do not get better as expected   How can you care for yourself at home?  Take your antibiotics exactly as directed. Do not stop taking the medicine just because you are feeling better. You need to take the full course of antibiotics.  Take your medicines exactly as prescribed. Call your doctor if you think you are having a problem with your medicine.  Get plenty of rest and sleep. You may feel weak and tired for a while, but your energy level will improve with  time.  To prevent dehydration, drink plenty of fluids, enough so that your urine is light yellow or clear like water. Choose water and other caffeine-free clear liquids until you feel better. If you have kidney, heart, or liver disease and have to limit fluids, talk with your doctor before you increase the amount of fluids you drink.  Take care of your cough so you can rest. A cough that brings up mucus from your lungs is common with pneumonia. It is one way your body gets rid of the infection. But if coughing keeps you from resting or causes severe fatigue and chest-wall pain, talk to your doctor. He or she may suggest that you take a medicine to reduce the cough.  Use a vaporizer or humidifier to add moisture to your bedroom. Follow the directions for cleaning the machine.  Do not smoke or allow others to smoke around you. Smoke will make your cough last longer. If you need help quitting, talk to your doctor about stop-smoking programs and medicines. These can increase your chances of quitting for good.  Take an over-the-counter pain medicine, such as acetaminophen (Tylenol), ibuprofen (Advil, Motrin), or naproxen (Aleve). Read and follow all instructions on the label.  Do not take two or more pain medicines at the same time unless the doctor told you to. Many pain medicines have acetaminophen, which is Tylenol.  Too much acetaminophen (Tylenol) can be harmful.  If you were given a spirometer to measure how well your lungs are working, use it as instructed. This can help your doctor tell how your recovery is going.  To prevent pneumonia in the future, talk to your doctor about getting a flu vaccine (once a year) and a pneumococcal vaccine (one time only for most people).

## 2019-10-14 ENCOUNTER — Ambulatory Visit: Admit: 2019-10-14 | Discharge: 2019-10-15

## 2019-10-14 DIAGNOSIS — R918 Other nonspecific abnormal finding of lung field: Principal | ICD-10-CM

## 2019-10-25 ENCOUNTER — Ambulatory Visit: Admit: 2019-10-25 | Discharge: 2019-10-26 | Attending: Internal Medicine | Primary: Internal Medicine

## 2019-10-25 ENCOUNTER — Ambulatory Visit: Admit: 2019-10-25 | Discharge: 2019-10-26

## 2019-10-25 DIAGNOSIS — R609 Edema, unspecified: Principal | ICD-10-CM

## 2019-10-25 DIAGNOSIS — R918 Other nonspecific abnormal finding of lung field: Principal | ICD-10-CM

## 2019-10-25 DIAGNOSIS — R55 Syncope and collapse: Principal | ICD-10-CM

## 2019-10-25 MED ORDER — FUROSEMIDE 40 MG TABLET
ORAL_TABLET | ORAL | 3 refills | 90.00000 days | Status: CP
Start: 2019-10-25 — End: ?

## 2019-11-16 ENCOUNTER — Ambulatory Visit: Admit: 2019-11-16 | Discharge: 2019-11-17

## 2020-01-31 ENCOUNTER — Ambulatory Visit: Admit: 2020-01-31 | Attending: Internal Medicine | Primary: Internal Medicine

## 2020-03-14 DIAGNOSIS — L4050 Arthropathic psoriasis, unspecified: Principal | ICD-10-CM

## 2020-03-14 MED ORDER — HUMIRA PEN CITRATE FREE 40 MG/0.4 ML
SUBCUTANEOUS | 3 refills | 84 days | Status: CP
Start: 2020-03-14 — End: ?

## 2020-03-15 DIAGNOSIS — L4050 Arthropathic psoriasis, unspecified: Principal | ICD-10-CM

## 2020-03-22 ENCOUNTER — Encounter: Admit: 2020-03-22 | Discharge: 2020-03-23 | Payer: MEDICARE

## 2020-03-22 DIAGNOSIS — Z79899 Other long term (current) drug therapy: Principal | ICD-10-CM

## 2020-03-22 DIAGNOSIS — L4050 Arthropathic psoriasis, unspecified: Principal | ICD-10-CM

## 2020-04-23 ENCOUNTER — Encounter: Admit: 2020-04-23 | Discharge: 2020-04-24 | Payer: MEDICARE | Attending: Internal Medicine | Primary: Internal Medicine

## 2020-04-23 DIAGNOSIS — R609 Edema, unspecified: Principal | ICD-10-CM

## 2020-04-23 DIAGNOSIS — R079 Chest pain, unspecified: Principal | ICD-10-CM

## 2020-06-22 ENCOUNTER — Other Ambulatory Visit: Payer: Self-pay

## 2020-06-28 ENCOUNTER — Encounter: Admit: 2020-06-28 | Discharge: 2020-06-29 | Payer: MEDICARE

## 2020-06-28 DIAGNOSIS — L4050 Arthropathic psoriasis, unspecified: Principal | ICD-10-CM

## 2020-06-28 MED ORDER — EMPTY CONTAINER
2 refills | 0 days
Start: 2020-06-28 — End: ?

## 2020-06-28 MED ORDER — SECUKINUMAB 150 MG/ML SUBCUTANEOUS SYRINGE
SUBCUTANEOUS | 3 refills | 0.00000 days | Status: CP
Start: 2020-06-28 — End: 2020-07-27

## 2020-06-29 DIAGNOSIS — L4050 Arthropathic psoriasis, unspecified: Principal | ICD-10-CM

## 2020-07-05 NOTE — Unmapped (Signed)
[facility] Specialty Medication Onboarding    Specialty Medication: Cosentyx 150mg /ml syringe  Prior Authorization: Approved   Financial Assistance: No - copay  <$25  Final Copay/Day Supply: $4 / 35 days (load) $4 /28 days (maintenance)    Insurance Restrictions: Yes - max 1 month supply     Notes to Pharmacist:     The triage team has completed the benefits investigation and has determined that the patient is able to fill this medication at [facility]. Please contact the patient to complete the onboarding or follow up with the prescribing physician as needed.

## 2020-07-06 DIAGNOSIS — L4050 Arthropathic psoriasis, unspecified: Principal | ICD-10-CM

## 2020-07-06 MED ORDER — SECUKINUMAB 150 MG/ML SUBCUTANEOUS PEN INJECTOR
SUBCUTANEOUS | 1 refills | 84.00000 days | Status: CP
Start: 2020-07-06 — End: 2020-07-06
  Filled 2020-07-11: qty 5, 63d supply, fill #0

## 2020-07-06 NOTE — Unmapped (Unsigned)
[facility]   Patient Onboarding/Medication Counseling    Carol Caldwell is a 65 y.o. female with psoriatic arthritis and psoriasis who I am counseling today on initiation of therapy.  I am speaking to the patient.    Was a Nurse, learning disability used for this call? No    Verified patient's date of birth / HIPAA.    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 Tuberculosis: Tuberculosis screening resulted in a non-reactive Quantiferon TB Gold assay.      Administration:     Prefilled syringe  1. Gather all supplies needed for injection on a clean, flat working surface: medication syringe(s) removed from packaging, alcohol swab, sharps container, etc.  2. Look at the medication label ??? look for correct medication, correct dose, and check the expiration date  3. Look at the medication ??? the liquid in the syringe should appear clear and colorless to slightly yellow  4. Lay the syringe on a flat surface and allow it to warm up to room temperature for at least 15-30 minutes  5. Select injection site ??? you can use the front of your thigh or your belly (but not the area 2 inches around your belly button); if someone else is giving you the injection you can also use your upper arm in the skin covering your triceps muscle  6. Prepare injection site ??? wash your hands and clean the skin at the injection site with an alcohol swab and let it air dry, do not touch the injection site again before the injection  7. Pull off the needle safety cap, do not remove until immediately prior to injection  8. Pinch the skin ??? with your hand not holding the syringe pinch up a fold of skin at the injection site using your forefinger and thumb  9. Insert the needle into the fold of skin at about a 45 degree angle ??? it's best to use a quick dart-like motion  10. Push the plunger down slowly as far as it will go until the syringe is empty, if the plunger is not fully depressed the needle shield will not extend to cover the needle when it is removed, hold the syringe in place for a full 5 seconds  11. Check that the syringe is empty and keep pressing down on the plunger while you pull the needle out at the same angle as inserted; after the needle is removed completely from the skin, release the plunger allowing the needle shield to activate and cover the used needle  12. Dispose of the used syringe immediately in your sharps disposal container, do not attempt to recap the needle prior to disposing  13. If you see any blood at the injection site, press a cotton ball or gauze on the site and maintain pressure until the bleeding stops, do not rub the injection site      Adherence/Missed dose instructions:  If your injection is given more than 4 days after your scheduled injection date ??? consult your pharmacist for additional instructions on how to adjust your dosing  schedule.        Goals of Therapy     ??? Minimize areas of skin involvement (% BSA)  ??? Avoidance of long term glucocorticoid use  ??? Achieve remission/inactive disease or low/minimal disease activity  ??? Maintenance of function  ??? Minimization of systemic manifestations and comorbidities  ??? Maintenance of effective psychosocial functioning      Side Effects & Monitoring Parameters     ??? Injection site reaction (redness, irritation, inflammation localized to the site of administration)  ??? Signs of a common cold ??? minor sore throat, runny or stuffy nose, etc.  ??? Diarrhea    The following side effects should be reported to the provider:  ??? Signs of a hypersensitivity reaction ??? rash; hives; itching; red, swollen, blistered, or peeling skin; wheezing; tightness in the chest or throat; difficulty breathing, swallowing, or talking; swelling of the mouth, face, lips, tongue, or throat; etc.  ??? Reduced immune function ??? report signs of infection such as fever; chills; body aches; very bad sore throat; ear or sinus pain; cough; more sputum or change in color of sputum; pain with passing urine; wound that will not heal, etc.  Also at a slightly higher risk of some malignancies (mainly skin and blood cancers) due to this reduced immune function.  o In the case of signs of infection ??? the patient should hold the next dose of Cosentyx?? and call your primary care provider to ensure adequate medical care.  Treatment may be resumed when infection is treated and patient is asymptomatic.  ??? Muscle pain or weakness  ??? Shortness of breath      Warnings, Precautions, & Contraindications     ??? Have your bloodwork checked as you have been told by your prescriber  ??? Talk with your doctor if you are pregnant, planning to become pregnant, or breastfeeding  ??? Discuss the possible need for holding your dose(s) of Cosentyx?? when a planned procedure is scheduled with the prescriber as it may delay healing/recovery timeline       Drug/Food Interactions     ??? Medication list reviewed in Epic. The patient was instructed to inform the care team before taking any new medications or supplements. No drug interactions identified.   ??? If you have a latex allergy use caution when handling, the needle cap of the Cosentyx?? prefilled syringe and the safety cap for the Cosentyx Sensoready?? pen contains a derivative of natural rubber latex  ??? Talk with you prescriber or pharmacist before receiving any live vaccinations while taking this medication and after you stop taking it      Storage, Handling Precautions, & Disposal     ??? Store this medication in the refrigerator.  Do not freeze  ??? If needed, you may store at room temperature for up to 1 hour  ??? Store in original packaging, protected from light  ??? Do not shake  ??? Dispose of used syringes/pens in a sharps disposal container    Current Medications (including OTC/herbals), Comorbidities and Allergies     Current Outpatient Medications   Medication Sig Dispense Refill   ??? DULoxetine (CYMBALTA) 30 MG capsule Take 30 mg by mouth daily.     ??? empagliflozin (JARDIANCE) 10 mg tablet Take 10 mg by mouth daily.     ??? furosemide (LASIX) 40 MG tablet Take 1 tablet (40 mg total) by mouth every other day. 45 tablet 3   ??? gabapentin (NEURONTIN) 300 MG capsule Take 600 mg by mouth Three (3)  times a day.     ??? metFORMIN (GLUCOPHAGE) 1000 MG tablet Take 1,000 mg by mouth 2 (two) times a day with meals.     ??? secukinumab 150 mg/mL Syrg Inject the contents of 1 syringe (150 mg) under the skin every seven (7) days for 5 total loading doses. 5 mL 0   ??? secukinumab 150 mg/mL Syrg Inject the contents of 1 syringe (150 mg) under the skin every twenty-eight (28) days. 3 mL 3     No current facility-administered medications for this visit.       No Known Allergies    There is no problem list on file for this patient.      Reviewed and up to date in Epic.    Appropriateness of Therapy     Acute infections noted within Epic:  No active infections  Patient reported infection: None    Is medication and dose appropriate based on diagnosis and infection status? Yes    Prescription has been clinically reviewed: Yes      Baseline Quality of Life Assessment      Rheumatology:   Quality of Life    On a scale of 1 ??? 10 with 1 representing not at all and 10 representing completely ??? how has your rheumatologic condition affected your:  Daily pain level?: decline to answer  Ability to complete your regular daily tasks (prepare meals, get dressed, etc.)?: decline to answer  Ability to participate in social or family activities?: decline to answer         Financial Information     Medication Assistance provided: Prior Authorization    Anticipated copay of $4 reviewed with patient. Verified delivery address.    Delivery Information     Scheduled delivery date: 07/10/2020    Expected start date: 07/10/2020    Medication will be delivered via Same Day Courier to the prescription address in [place].  This shipment will not require a signature.      Explained the services we provide at [facility] and that each month we would call to set up refills.  Stressed importance of returning phone calls so that we could ensure they receive their medications in time each month.  Informed patient that we should be setting up refills 7-10 days prior to when they will run out of medication.  A pharmacist will reach out to perform a clinical assessment periodically.  Informed patient that a welcome packet, containing information about our pharmacy and other support services, a Notice of Privacy Practices, and a drug information handout will be sent.      Patient verbalized understanding of the above information as well as how to contact the pharmacy at [phone] option 4 with any questions/concerns.  The pharmacy is open Monday through Friday 8:30am-4:30pm.  A pharmacist is available 24/7 via pager to answer any clinical questions they may have.    Patient Specific Needs     - Does the patient have any physical, cognitive, or cultural barriers? No    - Patient prefers to have medications discussed with  Patient     - Is the patient or caregiver able to read and understand education materials at a high school level or above? Yes    - Patient's primary language is  English     - Is the patient high risk? No    - Does the patient require a Care Management Plan? No     - Does the patient  require physician intervention or other additional services (i.e. nutrition, smoking cessation, social work)? No      Karene Fry Judith Campillo  [facility] Specialty Pharmacist single:19197::No,Yes}    - Patient's primary language is  {Blank single:19197::English,Spanish,***}     - Is the patient high risk? {sschighriskpts:78327}    - Does the patient require a Care Management Plan? {Blank single:19197::No,Yes}     - Does the patient require physician intervention or other additional services (i.e. nutrition, smoking cessation, social work)? {Blank single:19197::No,Yes - ***}      ***  [facility] Specialty Pharmacist

## 2020-07-10 NOTE — Unmapped (Signed)
Carol Caldwell 's Cosentyx shipment will be delayed as a result of insufficient inventory of the drug.     I have reached out to the patient  at [phone] and communicated the delay. We will reschedule the medication for the delivery date that the patient agreed upon.  We have confirmed the delivery date as 07/11/20, via same day courier.

## 2020-07-11 MED FILL — EMPTY CONTAINER: 120 days supply | Qty: 1 | Fill #0

## 2020-08-06 ENCOUNTER — Ambulatory Visit: Admit: 2020-08-06 | Discharge: 2020-08-07 | Payer: MEDICARE

## 2020-08-06 DIAGNOSIS — R222 Localized swelling, mass and lump, trunk: Principal | ICD-10-CM

## 2020-08-09 ENCOUNTER — Other Ambulatory Visit: Payer: Self-pay | Admitting: Pediatrics

## 2020-08-09 DIAGNOSIS — Z13820 Encounter for screening for osteoporosis: Secondary | ICD-10-CM

## 2020-08-14 MED ORDER — BRIMONIDINE 0.2 % EYE DROPS
0 days
Start: 2020-08-14 — End: 2020-10-23

## 2020-08-23 ENCOUNTER — Other Ambulatory Visit: Payer: Self-pay | Admitting: Pediatrics

## 2020-08-23 DIAGNOSIS — Z1231 Encounter for screening mammogram for malignant neoplasm of breast: Secondary | ICD-10-CM

## 2020-08-24 NOTE — Unmapped (Signed)
[facility] Clinical Assessment & Refill Coordination Note    Carol Caldwell, DOB: 16-Aug-1955  Phone: [phone] (home)     All above HIPAA information was verified with patient.     Was a Nurse, learning disability used for this call? No    Specialty Medication(s):   Inflammatory Disorders: Cosentyx     Current Outpatient Medications   Medication Sig Dispense Refill   ??? DULoxetine (CYMBALTA) 30 MG capsule Take 30 mg by mouth daily.     ??? empagliflozin (JARDIANCE) 10 mg tablet Take 10 mg by mouth daily.     ??? empty container Misc Use as directed 1 each 2   ??? furosemide (LASIX) 40 MG tablet Take 1 tablet (40 mg total) by mouth every other day. 45 tablet 3   ??? gabapentin (NEURONTIN) 300 MG capsule Take 600 mg by mouth Three (3) times a day.     ??? metFORMIN (GLUCOPHAGE) 1000 MG tablet Take 1,000 mg by mouth 2 (two) times a day with meals.     ??? secukinumab 150 mg/mL Syrg Inject the contents of 1 syringe (150mg ) under the skin every 7 days for 5 total loading doses 5 mL 0   ??? secukinumab 150 mg/mL Syrg Inject the contents of 1 syringe (150 mg) under the skin every twenty-eight (28) days. 3 mL 3     No current facility-administered medications for this visit.        Changes to medications: Taura reports no changes at this time.    No Known Allergies    Changes to allergies: No    SPECIALTY MEDICATION ADHERENCE     Cosentyx 150 mg/ml: 0 days of medicine on hand     Medication Adherence    Patient reported X missed doses in the last month: 0  Specialty Medication: Cosentyx 150mg /ml          Specialty medication(s) dose(s) confirmed: Regimen is correct and unchanged.     Are there any concerns with adherence? No    Adherence counseling provided? Not needed    CLINICAL MANAGEMENT AND INTERVENTION      Clinical Benefit Assessment:    Do you feel the medicine is effective or helping your condition? Yes    Clinical Benefit counseling provided? Not needed    Adverse Effects Assessment:    Are you experiencing any side effects? No    Are you experiencing difficulty administering your medicine? No    Quality of Life Assessment:    Rheumatology:   Quality of Life    On a scale of 1 ??? 10 with 1 representing not at all and 10 representing completely ??? how has your rheumatologic condition affected your:  Daily pain level?: decline to answer  Ability to complete your regular daily tasks (prepare meals, get dressed, etc.)?: decline to answer  Ability to participate in social or family activities?: decline to answer         Have you discussed this with your provider? Not needed    Acute Infection Status:    Acute infections noted within Epic:  No active infections  Patient reported infection: None    Therapy Appropriateness:    Is therapy appropriate? Yes, therapy is appropriate and should be continued    DISEASE/MEDICATION-SPECIFIC INFORMATION      For patients on injectable medications: Patient currently has 0 doses left.  Next injection is scheduled for 09/05/2020.    PATIENT SPECIFIC NEEDS     - Does the patient have  any physical, cognitive, or cultural barriers? No    - Is the patient high risk? No    - Does the patient require a Care Management Plan? No     - Does the patient require physician intervention or other additional services (i.e. nutrition, smoking cessation, social work)? No      SHIPPING     Specialty Medication(s) to be Shipped:   Inflammatory Disorders: Cosentyx 150mg /ml    Other medication(s) to be shipped: No additional medications requested for fill at this time     Changes to insurance: No    Delivery Scheduled: Yes, Expected medication delivery date: 08/31/2020.     Medication will be delivered via Same Day Courier to the confirmed prescription address in [facility].    The patient will receive a drug information handout for each medication shipped and additional FDA Medication Guides as required.  Verified that patient has previously received a Conservation officer, historic buildings and a Surveyor, mining.    The patient or caregiver noted above participated in the development of this care plan and knows that they can request review of or adjustments to the care plan at any time.      All of the patient's questions and concerns have been addressed.    Karene Fry Maisa Bedingfield   [facility] Specialty Pharmacist

## 2020-08-27 ENCOUNTER — Other Ambulatory Visit: Payer: Disability Insurance

## 2020-08-28 DIAGNOSIS — R222 Localized swelling, mass and lump, trunk: Principal | ICD-10-CM

## 2020-08-31 ENCOUNTER — Other Ambulatory Visit: Payer: Self-pay | Admitting: Pediatrics

## 2020-08-31 DIAGNOSIS — Z1231 Encounter for screening mammogram for malignant neoplasm of breast: Secondary | ICD-10-CM

## 2020-08-31 DIAGNOSIS — L4050 Arthropathic psoriasis, unspecified: Principal | ICD-10-CM

## 2020-08-31 MED FILL — COSENTYX 150 MG/ML SUBCUTANEOUS SYRINGE: SUBCUTANEOUS | 28 days supply | Qty: 1 | Fill #0

## 2020-09-05 ENCOUNTER — Other Ambulatory Visit: Payer: Self-pay

## 2020-09-05 ENCOUNTER — Ambulatory Visit
Admission: RE | Admit: 2020-09-05 | Discharge: 2020-09-05 | Disposition: A | Discharge status: Home / Self Care | Payer: Medicare HMO | Source: Ambulatory Visit | Attending: Pediatrics | Admitting: Pediatrics

## 2020-09-05 ENCOUNTER — Ambulatory Visit: Admit: 2020-09-05 | Discharge: 2020-09-06 | Payer: MEDICARE

## 2020-09-05 DIAGNOSIS — Z780 Asymptomatic menopausal state: Secondary | ICD-10-CM | POA: Diagnosis not present

## 2020-09-05 DIAGNOSIS — Z13820 Encounter for screening for osteoporosis: Secondary | ICD-10-CM | POA: Diagnosis present

## 2020-09-05 DIAGNOSIS — E119 Type 2 diabetes mellitus without complications: Secondary | ICD-10-CM | POA: Insufficient documentation

## 2020-09-28 NOTE — Unmapped (Signed)
[facility] Refill Coordination Note    Specialty Medication(s) to be Shipped:   Inflammatory Disorders: Cosentyx    Other medication(s) to be shipped: No additional medications requested for fill at this time     Carol Caldwell, DOB: Nov 04, 1955  Phone: [phone] (home)       All above HIPAA information was verified with patient.     Was a Nurse, learning disability used for this call? No    Completed refill call assessment today to schedule patient's medication shipment from the [facility] [phone]).  All relevant notes have been reviewed.     Specialty medication(s) and dose(s) confirmed: Regimen is correct and unchanged.   Changes to medications: [name] reports no changes at this time.  Changes to insurance: No  New side effects reported not previously addressed with a pharmacist or physician: None reported  Questions for the pharmacist: No    Confirmed patient received a Conservation officer, historic buildings and a Surveyor, mining with first shipment. The patient will receive a drug information handout for each medication shipped and additional FDA Medication Guides as required.       DISEASE/MEDICATION-SPECIFIC INFORMATION        For patients on injectable medications: Patient currently has 0 doses left.  Next injection is scheduled for 10/09/20.    SPECIALTY MEDICATION ADHERENCE     Medication Adherence    Patient reported X missed doses in the last month: 0  Specialty Medication: cosentyx  Patient is on additional specialty medications: No              Were doses missed due to medication being on hold? No        REFERRAL TO PHARMACIST     Referral to the pharmacist: Not needed      [facility]     Shipping address confirmed in Epic.     Delivery Scheduled: Yes, Expected medication delivery date: 10/02/20.     Medication will be delivered via Same Day Courier to the prescription address in Epic [facility].    Carol Caldwell   [facility] Specialty Technician

## 2020-10-02 NOTE — Unmapped (Signed)
Carol Caldwell 's Cosentyx shipment will be delayed as a result of insufficient inventory of the drug.     I have reached out to the patient and communicated the delay. We will reschedule the medication for the delivery date that the patient agreed upon.  We have confirmed the delivery date as 10/03/20, via same day courier.

## 2020-10-03 ENCOUNTER — Ambulatory Visit: Admit: 2020-10-03 | Discharge: 2020-10-04 | Payer: MEDICARE

## 2020-10-03 DIAGNOSIS — Z79899 Other long term (current) drug therapy: Principal | ICD-10-CM

## 2020-10-03 DIAGNOSIS — L4050 Arthropathic psoriasis, unspecified: Principal | ICD-10-CM

## 2020-10-03 LAB — CBC W/ AUTO DIFF
BASOPHILS ABSOLUTE COUNT: 0 10*9/L (ref 0.0–0.1)
BASOPHILS RELATIVE PERCENT: 0.6 %
EOSINOPHILS ABSOLUTE COUNT: 0.1 10*9/L (ref 0.0–0.5)
EOSINOPHILS RELATIVE PERCENT: 1.5 %
HEMATOCRIT: 34.2 % (ref 34.0–44.0)
HEMOGLOBIN: 11.5 g/dL (ref 11.3–14.9)
LYMPHOCYTES ABSOLUTE COUNT: 1.8 10*9/L (ref 1.1–3.6)
LYMPHOCYTES RELATIVE PERCENT: 44.9 %
MEAN CORPUSCULAR HEMOGLOBIN CONC: 33.7 g/dL (ref 32.0–36.0)
MEAN CORPUSCULAR HEMOGLOBIN: 30.2 pg (ref 25.9–32.4)
MEAN CORPUSCULAR VOLUME: 89.8 fL (ref 77.6–95.7)
MEAN PLATELET VOLUME: 7.7 fL (ref 6.8–10.7)
MONOCYTES ABSOLUTE COUNT: 0.3 10*9/L (ref 0.3–0.8)
MONOCYTES RELATIVE PERCENT: 7.6 %
NEUTROPHILS ABSOLUTE COUNT: 1.9 10*9/L (ref 1.8–7.8)
NEUTROPHILS RELATIVE PERCENT: 45.4 %
NUCLEATED RED BLOOD CELLS: 0 /100{WBCs} (ref <=4)
PLATELET COUNT: 223 10*9/L (ref 150–450)
RED BLOOD CELL COUNT: 3.81 10*12/L — ABNORMAL LOW (ref 3.95–5.13)
RED CELL DISTRIBUTION WIDTH: 13.8 % (ref 12.2–15.2)
WBC ADJUSTED: 4.1 10*9/L (ref 3.6–11.2)

## 2020-10-03 LAB — ALT: ALT (SGPT): 8 U/L — ABNORMAL LOW (ref 10–49)

## 2020-10-03 LAB — AST: AST (SGOT): 14 U/L (ref <=34)

## 2020-10-03 LAB — CREATININE
CREATININE: 0.94 mg/dL — ABNORMAL HIGH
EGFR CKD-EPI (2021) FEMALE: 67 mL/min/{1.73_m2} (ref >=60)

## 2020-10-03 LAB — ALBUMIN: ALBUMIN: 3.5 g/dL (ref 3.4–5.0)

## 2020-10-03 MED ORDER — SECUKINUMAB 150 MG/ML SUBCUTANEOUS SYRINGE
SUBCUTANEOUS | 3 refills | 0.00000 days | Status: CP
Start: 2020-10-03 — End: ?
  Filled 2020-10-04: qty 2, 28d supply, fill #0

## 2020-10-03 NOTE — Unmapped (Signed)
REASON FOR VISIT: f/u psoriatic arthritis      HISTORY: Carol Caldwell is a 65 y.o. 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   Treatment history:  - We have avoided mtx/leflunomide due to alcohol use.   - Humira started 09/2016  - Switched to cosentyx 06/2020  - Current med regimen: cosentyx 150 mg sq qmo     Interim history:   Pt presents for f/u.   Had cataract surgery yesterday, and this went well.     She has completed the cosentyx load and has had 1 monthly dose. She tolerates this well. She does feel that it works better than the cosentyx for her pain, but she feels it doesn't last the full month. She felt better when she was taking the weekly dosing, but feels the shots only provide relief for 6-14 days. She took the monthly shot about 3 wks ago, and started noticing more pain in the last week. Pain in low back, elbows, knees. She has been taking IBU some days for pain, takes BID if the pain is bad. She was told by PCP to not take IBU due to elevated LFTs.     She has cut back on alcohol. Does not drink daily, drinking 2-3 days weekly. When she drinks, she typically drinks three 40 oz alcoholic beverages on the weekends. We discussed the recommendation to abstain from alcohol due to reported elevated LFTs.     She took IBU today and has no pain currently. But she would like to be pain free without needing IBU.     No psoriasis of skin currently.         CURRENT MEDICATIONS:  Current Outpatient Medications   Medication Sig Dispense Refill   ??? DULoxetine (CYMBALTA) 30 MG capsule Take 30 mg by mouth daily.     ??? empagliflozin (JARDIANCE) 10 mg tablet Take 10 mg by mouth daily.     ??? empty container Misc Use as directed 1 each 2   ??? furosemide (LASIX) 40 MG tablet Take 1 tablet (40 mg total) by mouth every other day. 45 tablet 3   ??? gabapentin (NEURONTIN) 300 MG capsule Take 600 mg by mouth Three (3) times a day.     ??? metFORMIN (GLUCOPHAGE) 1000 MG tablet Take 1,000 mg by mouth 2 (two) times a day with meals.     ??? secukinumab 150 mg/mL Syrg Inject the contents of 1 syringe (150mg ) under the skin every 28 days 3 mL 3     No current facility-administered medications for this visit.       Past Medical History:   Diagnosis Date   ??? Diabetes mellitus (CMS-HCC)    ??? Psoriasis    ??? Psoriatic arthritis (CMS-HCC)         Record Review: Available records were reviewed, including pertinent office visits, labs, and imaging.      REVIEW OF SYSTEMS: Ten system were reviewed and negative except as noted above.    PHYSICAL EXAM:  Patient reported vitals:  Vitals:    10/03/20 1255   BP: 144/76   BP Site: L Arm   BP Position: Sitting   BP Cuff Size: Medium   Pulse: 86   Temp: 36.2 ??C (97.2 ??F)   TempSrc: Temporal   Weight: 78.5 kg (173 lb)   Height: 154.9 cm (5' 1)      General:   Pleasant 65 y.o.female in no acute distress, WDWN   Eyes:   Wearing  dark glasses due to cataract surgery    Cardiovascular:  Regular rate and rhythm. No murmur, rub, or gallop. No lower extremity edema.    Lungs:  Clear to auscultation.Normal respiratory effort.    Musculoskeletal:   General: Ambulates w/o assistance   Hands: No swelling. Tenderness of R 3rd PIP. Able to make a tight fist b/l.   Wrists:FROM w/o swelling or tenderness   Elbows: flexion contracture and mild swelling on the R with pain.   Shoulders: Stiffness in rotation on the L.   Spine: Modified Schober with 4 cm difference.  Occiput wall 0 cm.  Hips: Reduced external rotation bilaterally L>R.   Knees: Reduced flexion with pain bilaterally.  No effusions.  Ankles: no swelling or tenderness   Feet: No pain with MTP squeeze    Psych:  Appropriate affect and mood   Skin:  No rashes.         ASSESSMENT/PLAN:  1. Psoriatic arthritis (CMS-HCC)  With improvement since change to cosentyx, but still some peripheral arthritis on exam, and reporting the effects of the cosentyx do not last the full 4 wks until next dose.   Increase cosentyx to 300 mg q 28 days.   - secukinumab 150 mg/mL Syrg; Inject the contents of 2 syringes (300 mg) under the skin every twenty-eight (28) days.  Dispense: 6 mL; Refill: 3    2. High risk medication use  Checking labs below to evaluate for medication toxicity.    - ALT  - Albumin  - AST  - CBC w/ Differential  - Creatinine      HCM:   - pneumonia vaccine: PCV 20 10/03/20  - COVID-19 vaccine status: Pfizer 11/16/2019, 12/22/2019, 04/19/2020. Declines 4th dose today   - Annual Influenza vaccine. Status: 06/28/2020  - Bone health: not on prednisone       RTC 4 mo with Dr Sullivan Lone  Greater than 30 minutes spent in visit with patient, including pre and postvisit activities.

## 2020-10-03 NOTE — Unmapped (Signed)
Increasing cosentyx to 2 shots monthly.

## 2020-10-04 DIAGNOSIS — L4050 Arthropathic psoriasis, unspecified: Principal | ICD-10-CM

## 2020-10-04 NOTE — Unmapped (Signed)
Clinical Assessment Needed For: Dose Change  Medication: Cosentyx 150mg /ml syringe  Last Fill Date/Day Supply: 10/02/20 / 28 days  Prior Authorization Required  Was previous dose already scheduled to fill: No    Notes to Pharmacist:

## 2020-10-04 NOTE — Unmapped (Signed)
Carol Caldwell 's cosentyx shipment will be sent out  as a result of a new prescription for the medication has been received.      I have reached out to the patient  at [phone] and communicated the delivery change. We will reschedule the medication for the delivery date that the patient agreed upon.  We have confirmed the delivery date as 10/04/20, via same day courier.

## 2020-10-04 NOTE — Unmapped (Signed)
[facility] Pharmacist has reviewed this new prescription.  Patient was counseled on this dosage change by Carol Caldwell- see epic note from 10/03/20.  Next refill call date adjusted if necessary.      Camillo Flaming, PharmD - Clinical Pharmacist - [facility]   [address], [place], [place] [address]   Phone: [phone] - Fax. [phone]

## 2020-10-08 ENCOUNTER — Encounter (INDEPENDENT_AMBULATORY_CARE_PROVIDER_SITE_OTHER): Payer: Medicare HMO | Admitting: Ophthalmology

## 2020-10-08 ENCOUNTER — Other Ambulatory Visit: Payer: Self-pay

## 2020-10-08 DIAGNOSIS — H353121 Nonexudative age-related macular degeneration, left eye, early dry stage: Secondary | ICD-10-CM | POA: Diagnosis not present

## 2020-10-08 DIAGNOSIS — H353112 Nonexudative age-related macular degeneration, right eye, intermediate dry stage: Secondary | ICD-10-CM | POA: Diagnosis not present

## 2020-10-08 DIAGNOSIS — E113311 Type 2 diabetes mellitus with moderate nonproliferative diabetic retinopathy with macular edema, right eye: Secondary | ICD-10-CM | POA: Diagnosis not present

## 2020-10-08 DIAGNOSIS — E113392 Type 2 diabetes mellitus with moderate nonproliferative diabetic retinopathy without macular edema, left eye: Secondary | ICD-10-CM | POA: Diagnosis not present

## 2020-10-08 DIAGNOSIS — H43813 Vitreous degeneration, bilateral: Secondary | ICD-10-CM

## 2020-10-08 DIAGNOSIS — Z1231 Encounter for screening mammogram for malignant neoplasm of breast: Principal | ICD-10-CM

## 2020-10-08 MED ORDER — POLYMYXIN B SULFATE 10,000 UNIT-TRIMETHOPRIM 1 MG/ML EYE DROPS
0 days
Start: 2020-10-08 — End: ?

## 2020-10-11 ENCOUNTER — Ambulatory Visit: Admit: 2020-10-11 | Payer: MEDICARE

## 2020-10-17 DIAGNOSIS — Z1231 Encounter for screening mammogram for malignant neoplasm of breast: Principal | ICD-10-CM

## 2020-10-21 DIAGNOSIS — E119 Type 2 diabetes mellitus without complications: Secondary | ICD-10-CM | POA: Insufficient documentation

## 2020-10-21 NOTE — Unmapped (Signed)
Assessment and Plan:   Carol Caldwell is a 65 y.o. female with a history of diabetes, psoriatic arthritis, LE edema,  who presents in clinic today for follow-up.        1. Edema  Symptoms overall well controlled but BP Is on the low side today and she does endorse transient orthostasis.  Discussed trial of changing furosemide to PRN swelling as it's possible she may not need it frequently with Jardiance on board. Decreased use may also help with nocturnal cramping.   Reviewd most recent labs from 3 weeks ago showing stable Cr;  BMP from January showed normal 'lytes.   Will repeat electrolytes given c/o nocturnal cramping.     2. DMII, elevated ASCVD risk, HLD  Pt is not on statin. Reports having been prescribed statin a couple of years ago but didn't want to take more medicine so she didn't start it. No lipids in chart. Will obtain fasting lipids today. Discussed that regardless of lipid levels, statin is recommended in setting of DM for cardiovascular event reduction. Reviewed risk/benefit - after discussion, she is open to statin initiation.  Will start rosuvastatin 10mg  every day today - repeat lipids in 2-3 months.       Orders Placed This Encounter   Procedures   ??? Lipid Panel   ??? Basic Metabolic Panel       Requested Prescriptions      No prescriptions requested or ordered in this encounter         Return for 3-4 months with SS ; labs today .      I personally spent greater than 30 minutes in face-to-face and non-face-to-face care of this patient, which includes all pre, intra, and post visit time on the date of service.      At least part, if not all, of this note has been dictated using [employer] account planner voice recognition software and as such may contain typographical errors not appreciated during proofreading.           Subjective:   PCP: Sarita Bottom, FNP  Patient: Carol Caldwell  DOB: 1956/01/06    Reason for visit:  Edema  HPI: Carol Caldwell is a 65 y.o. female with a history of diabetes who presents in clinic today for follow-up.      At last visit with Dr. Zenaida Deed 6 months ago, pt was encouraged to try PPI for chest discomfort thought most likely non cardiac ( Chart review of my last note 2019 shows chest discomfrot improved with PPI at that time) .  She didn't start PPI but also hasn't had any further recurrence of chest pain.   She reports she is doing great right now.   Again, denies any chest pain also denies any shortness of breath, PND, orthopnea, palpitations, presyncope or syncope though she does note some occasional orthostatic lightheadedness.  No falls in the last year or so.  Her edema is very well controlled.  She does complain of nocturnal cramping.    ______________________________________________________________________    Pertinent Medical History, Cardiovascular History & Procedures:    Pertinent PMH:  ?? Diabetes  ?? Psoriatic arthritis    Cath / PCI:  ?? None    CV Surgery:  ??  None    EP Procedures and Devices:  ?? None    Non-Invasive Evaluation(s):  ?? Myocardial perfusion study 7/19: Equivocal due to marked adjacent gut activity overlying inferolateral segments.  No significant ischemia is appreciated involving any other segments  ??  Stress echocardiogram 1/19: No echocardiographic evidence of inducible ischemia but patient only exercised 3 minutes and 6 seconds on Bruce protocol  ?? 6/18: Normal EF, aortic sclerosis, dilated PA, mild pulmonary hypertension    ______________________________________________________________________    Other past medical history, social history, family history, medications, allergies and problem list reviewed in the medical record.    Current cardiac medications include:  ?? Furosemide 40 mg every other day --> prn today   ?? Jardiance 10mg   ( rx'd for DM)   ?? New today: Rosuvastatin 10mg  every day       Objective:     BP 92/52 (BP Site: L Arm, BP Position: Sitting, BP Cuff Size: Medium)  - Pulse 78  - Temp 36 ??C (96.8 ??F)  - Ht 154.9 cm (5' 1)  - Wt 75.8 kg (167 lb 3.2 oz)  - SpO2 100%  - BMI 31.59 kg/m??       PHYSICAL EXAMINATION:   GENERAL:  Alert, NAD  ENT: Wearing a mask  CARDIOVASCULAR:  Regular rate and rhythm with rare ectopy , normal S1/S2, no murmurs, rubs, or gallops.  No significant edema.  No carotid bruit.  RESPIRATORY:  Clear to auscultation bilaterally.  No wheezes, crackles, or rhonchi. Normal work of breathing.        ______________________________________________________________________    EKG 04/23/20 shows normal sinus rhythm, normal axis, and no diagnostic ischemic changes.       Recent CV pertinent labs:  Lab Results   Component Value Date    Creatinine 0.94 (H) 10/03/2020    Creatinine 0.8 10/04/2018    Creatinine Whole Blood, POC 0.7 05/16/2016    Potassium 4.2 04/23/2020    Potassium 3.8 10/04/2018    BUN 20 04/23/2020    BUN 16.00 10/04/2018

## 2020-10-22 NOTE — Unmapped (Signed)
[facility] Clinical Assessment & Refill Coordination Note    Carol Caldwell, DOB: [birth year]  Phone: [phone] (home)     All above HIPAA information was verified with patient.     Was a Nurse, learning disability used for this call? No    Specialty Medication(s):   Inflammatory Disorders: Cosentyx     Current Outpatient Medications   Medication Sig Dispense Refill   ??? DULoxetine (CYMBALTA) 30 MG capsule Take 30 mg by mouth daily.     ??? empagliflozin (JARDIANCE) 10 mg tablet Take 10 mg by mouth daily.     ??? empty container Misc Use as directed 1 each 2   ??? furosemide (LASIX) 40 MG tablet Take 1 tablet (40 mg total) by mouth every other day. 45 tablet 3   ??? gabapentin (NEURONTIN) 300 MG capsule Take 600 mg by mouth Three (3) times a day.     ??? metFORMIN (GLUCOPHAGE) 1000 MG tablet Take 1,000 mg by mouth 2 (two) times a day with meals.     ??? secukinumab 150 mg/mL Syrg Inject the contents of 2 syringes (300 mg) under the skin every twenty-eight (28) days. 6 mL 3     No current facility-administered medications for this visit.        Changes to medications: Carol Caldwell reports no changes at this time.    No Known Allergies    Changes to allergies: No    SPECIALTY MEDICATION ADHERENCE     Cosentyx 150 mg/ml: 15 days of medicine on hand       Medication Adherence    Patient reported X missed doses in the last month: 0  Specialty Medication: Cosentyx 150 mg/mL  Informant: patient          Specialty medication(s) dose(s) confirmed: Patient reports changes to the regimen as follows: Cosentyx increased at last visit to 300mg /mL every 28 days     Are there any concerns with adherence? No    Adherence counseling provided? Not needed    CLINICAL MANAGEMENT AND INTERVENTION      Clinical Benefit Assessment:    Do you feel the medicine is effective or helping your condition? Yes    Clinical Benefit counseling provided? Progress note from 10/03/20 shows evidence of clinical benefit    Adverse Effects Assessment:    Are you experiencing any side effects? No    Are you experiencing difficulty administering your medicine? No    Quality of Life Assessment:    Quality of Life    Rheumatology  On a scale of 1 - 10 with 1 representing not at all and 10 representing completely - how has your rheumatologic condition affected your:  Oncology  Dermatology               Have you discussed this with your provider? Not needed    Acute Infection Status:    Acute infections noted within Epic:  No active infections  Patient reported infection: None    Therapy Appropriateness:    Is therapy appropriate? Yes, therapy is appropriate and should be continued    DISEASE/MEDICATION-SPECIFIC INFORMATION      For patients on injectable medications: Patient currently has 0 doses left.  Next injection is scheduled for 11/06/20.    PATIENT SPECIFIC NEEDS     - Does the patient have any physical, cognitive, or cultural barriers? No    - Is the patient high risk? No    - Does the patient require a Care Management  Plan? No     - Does the patient require physician intervention or other additional services (i.e. nutrition, smoking cessation, social work)? No      SHIPPING     Specialty Medication(s) to be Shipped:   Inflammatory Disorders: Cosentyx    Other medication(s) to be shipped: No additional medications requested for fill at this time     Changes to insurance: No    Delivery Scheduled: Yes, Expected medication delivery date: 11/01/20.     Medication will be delivered via Same Day Courier to the confirmed prescription address in [facility].    The patient will receive a drug information handout for each medication shipped and additional FDA Medication Guides as required.  Verified that patient has previously received a Conservation officer, historic buildings and a Surveyor, mining.    The patient or caregiver noted above participated in the development of this care plan and knows that they can request review of or adjustments to the care plan at any time. All of the patient's questions and concerns have been addressed.    Camillo Flaming   [facility] Specialty Pharmacist

## 2020-10-23 ENCOUNTER — Ambulatory Visit: Admit: 2020-10-23 | Discharge: 2020-10-24 | Payer: MEDICARE

## 2020-10-23 DIAGNOSIS — Z79899 Other long term (current) drug therapy: Principal | ICD-10-CM

## 2020-10-23 DIAGNOSIS — R600 Localized edema: Principal | ICD-10-CM

## 2020-10-23 DIAGNOSIS — Z5181 Encounter for therapeutic drug level monitoring: Principal | ICD-10-CM

## 2020-10-23 DIAGNOSIS — E785 Hyperlipidemia, unspecified: Principal | ICD-10-CM

## 2020-10-23 LAB — LIPID PANEL
CHOLESTEROL/HDL RATIO SCREEN: 4.2 (ref 0.0–4.5)
CHOLESTEROL: 221 mg/dL — ABNORMAL HIGH (ref 0–200)
HDL CHOLESTEROL: 53 mg/dL (ref 40–60)
LDL CHOLESTEROL CALCULATED: 150 mg/dL — ABNORMAL HIGH (ref 30–130)
NON-HDL CHOLESTEROL: 168 mg/dL
TRIGLYCERIDES: 88 mg/dL (ref <=150)
VLDL CHOLESTEROL CAL: 17.6 mg/dL — ABNORMAL LOW (ref 25–40)

## 2020-10-23 LAB — BASIC METABOLIC PANEL
ANION GAP: 12 mmol/L
BLOOD UREA NITROGEN: 24 mg/dL (ref 5–26)
BUN / CREAT RATIO: 21 (ref 12–25)
CALCIUM: 8.8 mg/dL (ref 8.5–10.5)
CHLORIDE: 104 mmol/L (ref 95–110)
CO2: 25.3 mmol/L (ref 21.0–31.0)
CREATININE: 1.14 mg/dL (ref 0.50–1.50)
EGFR CKD-EPI (2021) FEMALE: 54 mL/min/{1.73_m2} — ABNORMAL LOW (ref >=60)
GLUCOSE RANDOM: 169 mg/dL — ABNORMAL HIGH (ref 60–100)
POTASSIUM: 3.9 mmol/L (ref 3.5–5.1)
SODIUM: 141 mmol/L (ref 136–145)

## 2020-10-23 MED ORDER — ROSUVASTATIN 10 MG TABLET
ORAL_TABLET | Freq: Every day | ORAL | 1 refills | 90 days | Status: CP
Start: 2020-10-23 — End: ?

## 2020-10-23 NOTE — Unmapped (Signed)
I will check your kidney function and cholesterol and we'll contact you with results.     You can change furosemide from every other day to just as needed fro sweling as this may be contributing to lightheadedness when you stand and cramping.   If you notice swelling, you can take the furosemide for 2-3 days until it resolves.   =  We also talked about medicine to reduce risk of heart attack and stroke. I will send it in to [facility] on [address]. You should let me know if you have any side effects to the medicine.

## 2020-10-24 ENCOUNTER — Ambulatory Visit: Admit: 2020-10-24 | Discharge: 2020-10-25 | Payer: MEDICARE

## 2020-10-24 NOTE — Unmapped (Signed)
-----   Message from Pilar Jarvis, RN sent at 10/24/2020  8:34 AM EDT -----    ----- Message -----  From: Adrian Prows, PA  Sent: 10/24/2020   6:57 AM EDT  To: Pilar Jarvis, RN    Please inform patient that though there were no worrisome findings on her kidney function test, I do think her kidneys will be  happier with only using the furosemide on an as-needed basis rather than every other day.    Cholesterol was higher than ideal.  For people with diabetes, would like to see the LDL less than 70-hers was 161.  Would also like to see the non-HDL less than 100, hers was 168.  Therefore I have sent in a prescription for rosuvastatin 10 mg for her to take once daily.  We will repeat her cholesterol test when she comes in to see me again in October.  Ideally, she would be fasting for that appointment.    Thanks  Ss

## 2020-10-24 NOTE — Unmapped (Signed)
FYI    Pt returned call--repeated and reinforced [name]'s instructions and patient was able to relay back understanding and agreed to only take the lasix prn and will start the rosuvastatin 10 mg as ordered, f/u with her and fasting lab in Oct.

## 2020-10-24 NOTE — Unmapped (Signed)
Therapy Update Follow Up: PA approved- rfts 7/14

## 2020-10-24 NOTE — Unmapped (Signed)
Carol Caldwell, informed her that Dierdre Harness PA had reviewed her labs and said that she did not find any worrisome findings on her kidney function test.    At this point our call was disconnected and I called her back and she asked that I call her in a few minutes due to being at a doctors appointment.

## 2020-11-01 MED FILL — COSENTYX 300 MG/2 SYRINGES (150 MG/ML) SUBCUTANEOUS: SUBCUTANEOUS | 28 days supply | Qty: 2 | Fill #1

## 2020-11-05 ENCOUNTER — Encounter (INDEPENDENT_AMBULATORY_CARE_PROVIDER_SITE_OTHER): Payer: Medicare HMO | Admitting: Ophthalmology

## 2020-11-05 ENCOUNTER — Other Ambulatory Visit: Payer: Self-pay

## 2020-11-05 DIAGNOSIS — E113211 Type 2 diabetes mellitus with mild nonproliferative diabetic retinopathy with macular edema, right eye: Secondary | ICD-10-CM | POA: Diagnosis not present

## 2020-11-05 DIAGNOSIS — E113392 Type 2 diabetes mellitus with moderate nonproliferative diabetic retinopathy without macular edema, left eye: Secondary | ICD-10-CM

## 2020-11-05 DIAGNOSIS — H43813 Vitreous degeneration, bilateral: Secondary | ICD-10-CM

## 2020-11-22 NOTE — Unmapped (Signed)
[facility] Refill Coordination Note    Specialty Medication(s) to be Shipped:   Inflammatory Disorders: Cosentyx    Other medication(s) to be shipped: No additional medications requested for fill at this time     Carol Caldwell, DOB: 01/12/1956  Phone: [phone] (home)       All above HIPAA information was verified with patient.     Was a Nurse, learning disability used for this call? No    Completed refill call assessment today to schedule patient's medication shipment from the [facility] [phone]).  All relevant notes have been reviewed.     Specialty medication(s) and dose(s) confirmed: Regimen is correct and unchanged.   Changes to medications: Carol Caldwell reports no changes at this time.  Changes to insurance: No  New side effects reported not previously addressed with a pharmacist or physician: None reported  Questions for the pharmacist: No    Confirmed patient received a Conservation officer, historic buildings and a Surveyor, mining with first shipment. The patient will receive a drug information handout for each medication shipped and additional FDA Medication Guides as required.       DISEASE/MEDICATION-SPECIFIC INFORMATION        For patients on injectable medications: Patient currently has 0 doses left.  Next injection is scheduled for 8/28.    SPECIALTY MEDICATION ADHERENCE     Medication Adherence    Patient reported X missed doses in the last month: 0  Specialty Medication: Cosentyx  Patient is on additional specialty medications: No  Patient is on more than two specialty medications: No  Any gaps in refill history greater than 2 weeks in the last 3 months: no  Demonstrates understanding of importance of adherence: yes  Informant: patient              Were doses missed due to medication being on hold? No    Cosentyx  150mg /ml: Patient has 0 days of medication on hand    REFERRAL TO PHARMACIST     Referral to the pharmacist: Not needed      [facility]     Shipping address confirmed in Epic. Delivery Scheduled: Yes, Expected medication delivery date: 8/19.     Medication will be delivered via Same Day Courier to the prescription address in Epic WAM.    [name]   [facility] Specialty Technician

## 2020-11-30 MED FILL — COSENTYX 300 MG/2 SYRINGES (150 MG/ML) SUBCUTANEOUS: SUBCUTANEOUS | 28 days supply | Qty: 2 | Fill #2

## 2020-12-03 ENCOUNTER — Encounter (INDEPENDENT_AMBULATORY_CARE_PROVIDER_SITE_OTHER): Payer: Medicare HMO | Admitting: Ophthalmology

## 2020-12-10 ENCOUNTER — Encounter (INDEPENDENT_AMBULATORY_CARE_PROVIDER_SITE_OTHER): Payer: Medicare HMO | Admitting: Ophthalmology

## 2020-12-24 ENCOUNTER — Other Ambulatory Visit: Payer: Self-pay

## 2020-12-24 ENCOUNTER — Encounter (INDEPENDENT_AMBULATORY_CARE_PROVIDER_SITE_OTHER): Payer: Medicare HMO | Admitting: Ophthalmology

## 2020-12-24 DIAGNOSIS — E113392 Type 2 diabetes mellitus with moderate nonproliferative diabetic retinopathy without macular edema, left eye: Secondary | ICD-10-CM | POA: Diagnosis not present

## 2020-12-24 DIAGNOSIS — E113311 Type 2 diabetes mellitus with moderate nonproliferative diabetic retinopathy with macular edema, right eye: Secondary | ICD-10-CM

## 2020-12-24 DIAGNOSIS — H43813 Vitreous degeneration, bilateral: Secondary | ICD-10-CM

## 2020-12-24 NOTE — Unmapped (Signed)
[facility] Refill Coordination Note    Specialty Medication(s) to be Shipped:   Inflammatory Disorders: Cosentyx    Other medication(s) to be shipped: No additional medications requested for fill at this time     Carol Caldwell, DOB: 1955-07-13  Phone: [phone] (home)       All above HIPAA information was verified with patient.     Was a Nurse, learning disability used for this call? No    Completed refill call assessment today to schedule patient's medication shipment from the [facility] [phone]).  All relevant notes have been reviewed.     Specialty medication(s) and dose(s) confirmed: Regimen is correct and unchanged.   Changes to medications: Cynara reports no changes at this time.  Changes to insurance: No  New side effects reported not previously addressed with a pharmacist or physician: None reported  Questions for the pharmacist: No    Confirmed patient received a Conservation officer, historic buildings and a Surveyor, mining with first shipment. The patient will receive a drug information handout for each medication shipped and additional FDA Medication Guides as required.       DISEASE/MEDICATION-SPECIFIC INFORMATION        For patients on injectable medications: Patient currently has 0 doses left.  Next injection is scheduled for 01/06/21.    SPECIALTY MEDICATION ADHERENCE     Medication Adherence    Patient reported X missed doses in the last month: 0  Specialty Medication: Cosentyx  Patient is on additional specialty medications: No  Informant: patient              Were doses missed due to medication being on hold? No    Cosentyx  150mg /ml: Patient has 0 days of medication on hand    REFERRAL TO PHARMACIST     Referral to the pharmacist: Not needed      [facility]     Shipping address confirmed in Epic.     Delivery Scheduled: Yes, Expected medication delivery date: 12/31/20.     Medication will be delivered via Same Day Courier to the prescription address in Epic WAM.    Jasper Loser   [facility] Specialty Technician

## 2020-12-31 MED FILL — COSENTYX 300 MG/2 SYRINGES (150 MG/ML) SUBCUTANEOUS: SUBCUTANEOUS | 28 days supply | Qty: 2 | Fill #3

## 2021-01-02 NOTE — Unmapped (Signed)
[place] [facility] of Medications Program (CAMP) Clinic-- Medication Adherence  UNREACHABLE    Carol Caldwell is a 65 y.o. female identified as having an adherence of <80%  for a hypertension medication, based on payer reports and chart review.       1st attempt call made to the patient's Home number(s). There was no answer & voicemail was left to return call.       Population:  HUMANA-MA    Medication(s):    ??? Blood pressure: enalapril 5 mg  o Last Filled on 08/22/20 for 90 day supply   o Refills remaining:YES  o       Additional Details & Actions Taken:   none      Marquis Buggy , CPhT  Certified Pharmacy Technician  [place] Assessment of Medications Program (CAMP)

## 2021-01-21 ENCOUNTER — Encounter (INDEPENDENT_AMBULATORY_CARE_PROVIDER_SITE_OTHER): Payer: Medicare HMO | Admitting: Ophthalmology

## 2021-01-21 ENCOUNTER — Other Ambulatory Visit: Payer: Self-pay

## 2021-01-21 DIAGNOSIS — E113292 Type 2 diabetes mellitus with mild nonproliferative diabetic retinopathy without macular edema, left eye: Secondary | ICD-10-CM | POA: Diagnosis not present

## 2021-01-21 DIAGNOSIS — H43813 Vitreous degeneration, bilateral: Secondary | ICD-10-CM | POA: Diagnosis not present

## 2021-01-21 DIAGNOSIS — E113311 Type 2 diabetes mellitus with moderate nonproliferative diabetic retinopathy with macular edema, right eye: Secondary | ICD-10-CM | POA: Diagnosis not present

## 2021-01-22 NOTE — Unmapped (Signed)
[facility] Refill Coordination Note    Specialty Medication(s) to be Shipped:   Inflammatory Disorders: Cosentyx    Other medication(s) to be shipped: No additional medications requested for fill at this time     Carol Caldwell, DOB: January 14, 1956  Phone: [phone] (home)       All above HIPAA information was verified with patient.     Was a Nurse, learning disability used for this call? No    Completed refill call assessment today to schedule patient's medication shipment from the [facility] [phone]).  All relevant notes have been reviewed.     Specialty medication(s) and dose(s) confirmed: Regimen is correct and unchanged.   Changes to medications: Lenee reports no changes at this time.  Changes to insurance: No  New side effects reported not previously addressed with a pharmacist or physician: None reported  Questions for the pharmacist: No    Confirmed patient received a Conservation officer, historic buildings and a Surveyor, mining with first shipment. The patient will receive a drug information handout for each medication shipped and additional FDA Medication Guides as required.       DISEASE/MEDICATION-SPECIFIC INFORMATION        For patients on injectable medications: Patient currently has 0 doses left.  Next injection is scheduled for 10/23.    SPECIALTY MEDICATION ADHERENCE     Medication Adherence    Patient reported X missed doses in the last month: 0  Specialty Medication: Cosentyx  Patient is on additional specialty medications: No  Patient is on more than two specialty medications: No  Any gaps in refill history greater than 2 weeks in the last 3 months: no  Demonstrates understanding of importance of adherence: yes  Informant: patient              Were doses missed due to medication being on hold? No    Cosentyx 150mg /ml: Patient has 0 days of medication on hand    REFERRAL TO PHARMACIST     Referral to the pharmacist: Not needed      [facility]     Shipping address confirmed in Epic. Delivery Scheduled: Yes, Expected medication delivery date: 10/20.     Medication will be delivered via Same Day Courier to the prescription address in Epic WAM.    [name]   [facility] Specialty Technician

## 2021-01-23 ENCOUNTER — Ambulatory Visit: Admit: 2021-01-23 | Discharge: 2021-01-24 | Payer: MEDICARE

## 2021-01-23 DIAGNOSIS — O26899 Other specified pregnancy related conditions, unspecified trimester: Principal | ICD-10-CM

## 2021-01-23 DIAGNOSIS — E785 Hyperlipidemia, unspecified: Principal | ICD-10-CM

## 2021-01-23 DIAGNOSIS — R252 Cramp and spasm: Principal | ICD-10-CM

## 2021-01-23 DIAGNOSIS — R600 Localized edema: Principal | ICD-10-CM

## 2021-01-23 DIAGNOSIS — Z5181 Encounter for therapeutic drug level monitoring: Principal | ICD-10-CM

## 2021-01-23 DIAGNOSIS — Z79899 Other long term (current) drug therapy: Principal | ICD-10-CM

## 2021-01-23 DIAGNOSIS — R109 Unspecified abdominal pain: Principal | ICD-10-CM

## 2021-01-23 LAB — LIPID PANEL
CHOLESTEROL/HDL RATIO SCREEN: 3.6 (ref 1.0–4.5)
CHOLESTEROL: 178 mg/dL (ref <=200)
HDL CHOLESTEROL: 49 mg/dL (ref 40–60)
LDL CHOLESTEROL CALCULATED: 100 mg/dL — ABNORMAL HIGH (ref 40–99)
NON-HDL CHOLESTEROL: 129 mg/dL (ref 70–130)
TRIGLYCERIDES: 145 mg/dL (ref 0–150)
VLDL CHOLESTEROL CAL: 29 mg/dL (ref 11–41)

## 2021-01-23 LAB — COMPREHENSIVE METABOLIC PANEL
ALBUMIN: 3.7 g/dL (ref 3.4–5.0)
ALKALINE PHOSPHATASE: 74 U/L (ref 46–116)
ALT (SGPT): 13 U/L (ref 10–49)
ANION GAP: 9 mmol/L (ref 5–14)
AST (SGOT): 21 U/L (ref <=34)
BILIRUBIN TOTAL: 0.5 mg/dL (ref 0.3–1.2)
BLOOD UREA NITROGEN: 19 mg/dL (ref 9–23)
BUN / CREAT RATIO: 19
CALCIUM: 9.5 mg/dL (ref 8.7–10.4)
CHLORIDE: 102 mmol/L (ref 98–107)
CO2: 28 mmol/L (ref 20.0–31.0)
CREATININE: 0.99 mg/dL — ABNORMAL HIGH
EGFR CKD-EPI (2021) FEMALE: 63 mL/min/{1.73_m2} (ref >=60)
GLUCOSE RANDOM: 148 mg/dL — ABNORMAL HIGH (ref 70–99)
POTASSIUM: 4.8 mmol/L (ref 3.4–4.8)
PROTEIN TOTAL: 7.6 g/dL (ref 5.7–8.2)
SODIUM: 139 mmol/L (ref 135–145)

## 2021-01-23 LAB — MAGNESIUM: MAGNESIUM: 2.5 mg/dL (ref 1.6–2.6)

## 2021-01-23 NOTE — Unmapped (Unsigned)
Assessment and Plan:   Carol Caldwell is a 65 y.o. female with a history of diabetes, psoriatic arthritis, LE edema,  who presents in clinic today for follow-up.     1. Edema  Stable with change from daily to prn lasix in setting of addition of Jardiance to her DM regimen.   Has had improvement in orthostasis and cramping since change  Reviewd most recent labs from July showing stable Cr;  BMP from January showed normal 'lytes.   Repeat CMP today.     2. DMII, elevated ASCVD risk, HLD  Lipids July'22 - no statin - showed TC 221, TG 88, HDL 53, LDL 150, non HDL 168.   Rosuvastatin was started at last visit. Tolerating well.   Repeat lipids today.   If lpids at goal, will continue current dose.     3. Cramping   LE cramping improved with change in lasix from daily to prn.  Continues with chronic intermittent L sided cramping she has been told is 2/2 nephrolithiasis. Neither are worse with initiation of statin.   Will check electrolytes, Vit D  as below. Recommend follow up with PCP.       Orders Placed This Encounter   Procedures   ??? Lipid Panel   ??? Comprehensive Metabolic Panel   ??? Magnesium Level   ??? Vitamin D 25 Hydroxy (25OH D2 + D3)       Requested Prescriptions      No prescriptions requested or ordered in this encounter         Return in about 6 months (around 07/24/2021) for with SS or JJK ; labs today .              Subjective:   PCP: Sarita Bottom, FNP  Patient: Carol Caldwell  DOB: 29-Apr-1955    Reason for visit: 47mo follow up   Edema  HPI: Carol Caldwell is a 65 y.o. female with a history of diabetes who presents in clinic today for follow-up.    At last visit with me 3 months ago, rosuvastatin was initiated and lasix was changed to PRN in setting of recent addition of jardiance to her DM regimen.  She has been doing better since then.   Edema is stable and well controlled. Hasn't needed lasix.   Reports rare episodic, non exertional  shortness of breath w/o associated symptoms or clear precipitant. Says it doesn't bother her.     Continues with episodic, mild, non exertional chest discomfort w/o associated symptoms or clear precipitant. Stable for a few years. Again, doesn't bother her much.     Tolerating rosuvastatin w/o difficulty.   Leg cramps are not as bad as at last visit.   For years she has had occasional cramping in her L side. Has been told it was 2/2 kidney stone. Can be severe pain. No trigger. No hematuria.     Admits to occasional orthostatic lightheadedness which has improved with stopping daily lasix. No presyncope/syncope or falls.     No dedicated exercise but is active caring for her adult disabled son. She also lives with her daughter and brother.           ______________________________________________________________________    Pertinent Medical History, Cardiovascular History & Procedures:    Pertinent PMH:  ?? Diabetes  ?? Psoriatic arthritis    Cath / PCI:  ?? None    CV Surgery:  ??  None    EP Procedures and Devices:  ?? None  Non-Invasive Evaluation(s):  ?? Myocardial perfusion study 7/19: Equivocal due to marked adjacent gut activity overlying inferolateral segments.  No significant ischemia is appreciated involving any other segments  ?? Stress echocardiogram 1/19: No echocardiographic evidence of inducible ischemia but patient only exercised 3 minutes and 6 seconds on Bruce protocol  ?? 6/18: Normal EF, aortic sclerosis, dilated PA, mild pulmonary hypertension    ______________________________________________________________________    Other past medical history, social history, family history, medications, allergies and problem list reviewed in the medical record.    Current cardiac medications include:  ?? Furosemide 40 mg every day  prn   ?? Jardiance 10mg   ( rx'd for DM)   ?? Rosuvastatin 10mg  every day       Objective:     BP 122/72 (BP Site: L Arm, BP Position: Sitting, BP Cuff Size: Medium)  - Pulse 73  - Temp 36.6 ??C (97.8 ??F) (Temporal)  - Wt 75.8 kg (167 lb)  - SpO2 99%  - BMI 31.55 kg/m??      Wt Readings from Last 3 Encounters:   01/23/21 75.8 kg (167 lb)   10/23/20 75.8 kg (167 lb 3.2 oz)   10/03/20 78.5 kg (173 lb)           PHYSICAL EXAMINATION:   GENERAL:  Alert, NAD  ENT: Wearing a mask  CARDIOVASCULAR:  Regular rate and rhythm with rare ectopy , normal S1/S2, no murmurs, rubs, or gallops.  No significant edema.  No carotid bruit.  RESPIRATORY:  Clear to auscultation bilaterally.  No wheezes, crackles, or rhonchi. Normal work of breathing.        ______________________________________________________________________    EKG 04/23/20 shows normal sinus rhythm, normal axis, and no diagnostic ischemic changes.       Recent CV pertinent labs:  Lab Results   Component Value Date    LDL Calculated 100 (H) 01/23/2021    Non-HDL Cholesterol 129 01/23/2021    HDL 49 01/23/2021    Creatinine 0.99 (H) 01/23/2021    Creatinine 0.8 10/04/2018    Creatinine Whole Blood, POC 0.7 05/16/2016    Potassium 4.8 01/23/2021    Potassium 3.8 10/04/2018    BUN 19 01/23/2021    BUN 16.00 10/04/2018 Creatinine 0.8 10/04/2018    Creatinine Whole Blood, POC 0.7 05/16/2016    Potassium 3.9 10/23/2020    Potassium 3.8 10/04/2018    BUN 24 10/23/2020    BUN 16.00 10/04/2018

## 2021-01-23 NOTE — Unmapped (Signed)
We will do some labwork today and call when results are ready.

## 2021-01-24 DIAGNOSIS — Z5181 Encounter for therapeutic drug level monitoring: Principal | ICD-10-CM

## 2021-01-24 DIAGNOSIS — E785 Hyperlipidemia, unspecified: Principal | ICD-10-CM

## 2021-01-24 DIAGNOSIS — Z79899 Other long term (current) drug therapy: Principal | ICD-10-CM

## 2021-01-24 MED ORDER — ROSUVASTATIN 20 MG TABLET
ORAL_TABLET | Freq: Every day | ORAL | 3 refills | 90 days | Status: CP
Start: 2021-01-24 — End: ?

## 2021-01-24 NOTE — Unmapped (Signed)
Left message to return call 

## 2021-01-24 NOTE — Unmapped (Signed)
Patient advised of results and recommendations. She will call back to schedule lab appointment in January.

## 2021-01-24 NOTE — Unmapped (Signed)
-----   Message from Adrian Prows, [place] sent at 01/24/2021  1:55 PM EDT -----  Please let pt know her cholesterol looks better on the rosuvastatin but isn't quite at goal yet. I recommend increasing to 20mg  a day. She can use 2 of the 10mg  tablets until her bottle runs out. I'll send in new Rx fro 20mg  tablets to take one a day when the others run out.     Liver and kidney labs are fine.     We can repeat fasting labs in January. I'll put order in. She can call back to scheudle or schedule lab appt with you.     Thanks!    Maralyn Sago

## 2021-01-24 NOTE — Unmapped (Signed)
Last clinic visit: 10/03/2020  With Carlus Pavlov PA-c    Chief complaint: Follow-up Psoriatic Arthritis (PsA)     History of Present Illness:     HPI:  Carol Caldwell is a 65 y.o. 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 She started humira in 09/2016, currently taking this weekly.??We have avoided mtx/leflunomide due to alcohol use.??    Events since last appt:   - Followed with Cardiology for edema, DMII, and cramping     Treatment history:  - We have avoided mtx/leflunomide due to alcohol use.   - Humira started 09/2016  - Switched to cosentyx 06/2020  - Increased to full dose of cosentyx (300 mg) in 09/2020    Current Treatment:  - Cosentyx 300 mg sq qmonth     Interval History:  She says that the main thing that has been been troubling her is the [facility] joint of her right hand. She says that she has a lot of pain with this thumb throughout the day that seems to be better on some days and worse on others, but is there every day. This has been hurting her for about 4 months. When she was first diagnosed with psoriatic arthritis, the pain was mainly in her back and she is worried that it is spreading. She thinks that increasing the dose of cosentyx has helped with her back pain but she still has that on and off (but still every day). She is no longer having any cramping, which she first had when she was diagnosed with PsA.    Additionally, her right knee has started hurting more. This also started about 4 months ago.    She has not noticed any rashes recently. She says that she has had pitting in her nails for a long time. She thinks that overall she is doing better on the cosyntex than she was on the Humira but she is not fully well controlled. She is wondering if there is anything that can be done to stop the pain spreading to other joints.    Record review:  I have reviewed the patient's allergies, medications, pertinent past medical, surgical, social and family history and have updated in Epic where appropriate. I have also reviewed the pertinent past medical records including notes, labs, imaging tests in the medical record and in Care Everywhere.     Objective    Physical Exam:  Vitals:    02/01/21 1423   BP: 126/76   BP Site: L Arm   BP Position: Sitting   Pulse: 87   Temp: 36.5 ??C (97.7 ??F)   TempSrc: Temporal   Weight: 72.6 kg (160 lb)     Body mass index is 30.23 kg/m??.  GENERAL: The patient is well appearing, in no acute distress. Ambulates around exam room easily and climbs on exam table without difficulty.  SKIN: No rash.   EYES: EOMI, PERRL. Sclera anicteric, conjunctiva non- injected.   ENT: mucus membranes moist.   Neck: supple, no cervical lymphadenopathy  Respiratory: Breathing non-labored, CTA bilaterally  CV: Heart rate regular, no murmurs  GI: Abdomen soft, nontender, nondistended, no hepatosplenomegaly  VASCULAR: warm and well perfused extremities, no c/c/e.  NEURO: CN 2-12 grossly intact.   PSYCH: No depression or anxiety. Cooperative. Alert and oriented.   MUSCULOSKELETAL:   ?? Neck with painless, unlimited ROM.??  ?? Swelling in bilateral elbows. Tenderness to palpation of right CMC. No swelling of wrist or hand joints. Crepitus in  right knee.    ?? Tenderness to palpation over SI joints  ?? Hips without limited ROM.??  ?? Tenderness to palpation of bilateral ankles; no tenderness in either foot      Assessment/Plan:     Carol Caldwell is a 65 y.o. female with a history of Psoriatic Arthritis (PsA) characterized by skin psoriasis and L sacroiliitis. She started humira in 09/2016, currently taking this weekly.??We have avoided mtx/leflunomide due to alcohol use.??  ??  Psoriatic Arthritis: Had increased back pain on Humira weekly (progression of PsA). She switched to Cosyntex in 06/2020 and increased to 300 mg q4 weeks in 09/2020. She does not seem to be fully well controlled on this medicine (still having back pain regularly although it is improved). She has swelling in bilateral elbows as well as tenderness to palpation. Some of her concerns seem more like osteoarthritis, particularly her right CMC joint. Discussed switching to other medications, like Stelara for the uncontrolled back pain. Patient says that she will think about this because she overall feels that she is at least doing better on the Cosyntex than the humira.    - Will update XR of her SI joints, bilat hands, and bilat feet.   - Continue Cosentyx 300 mg q4 weeks for now.   ??  High risk medication monitoring:   - Patient is currently taking Cosyntex; no regular monitoring needed  - Patient had a quant gold in 09/03/2016 that was neg.  she does not have any risk factors for TB exposure - no travel to TB-endemic area, does not volunteer, work, or live at [employer] or correctional facility, does not care for TB patients, no contact with individual with active pulmonary TB. Does not require repeat quant gold unless she has new risk factors for TB exposure. TB risk factors were reviewed: 03/22/2020.    ??  Immunization Counseling:  Prevnar PCV-20: 10/03/2020    Influenza: 06/28/2020    Covid Vaccination:   Dose 1 11/16/2019  Dose 2 12/22/2019  Dose 3 04/19/2020  Bivalent booster: 02/01/2021 Today in clinic       We discussed the above including diagnosis and recommendations, agreed on the above plan, and all questions were answered.    Follow-up:     Return for Follow-up PsA, 3 months with Carlus Pavlov, 8-9 months with me.    Rick Duff MD  [facility] Internal Medicine PGY-2  [phone]       There are no diagnoses linked to this encounter.

## 2021-01-24 NOTE — Unmapped (Signed)
Pt called clinic back for annotated lab results--provided [name]'s message and medication instructions--to agrees to comply and will call in Dec to schedule the lab.

## 2021-01-25 LAB — VITAMIN D 25 HYDROXY: VITAMIN D, TOTAL (25OH): 11.2 ng/mL — ABNORMAL LOW (ref 20.0–80.0)

## 2021-01-25 MED ORDER — ERGOCALCIFEROL (VITAMIN D2) 1,250 MCG (50,000 UNIT) CAPSULE
ORAL_CAPSULE | ORAL | 2 refills | 28 days | Status: CP
Start: 2021-01-25 — End: ?

## 2021-01-28 NOTE — Unmapped (Signed)
Called and informed Carol Caldwell of lab results and [name]'s recommendation on starting Vitamin D supplement as well as informed her to check with her PCP on this and schedule a follow up with them in about 6 weeks. Let her know a prescription was sent to the pharmacy.  Informed her that Dierdre Harness would like her to also increase her rosuvastatin to 20mg  daily due to her cholesterol lab work. She is agreeable. Informed her she can take 2 of her 10mg  tabs until out then pick up the 20mg  tablets. She will call the office to set up January fasting lab work.

## 2021-01-28 NOTE — Unmapped (Signed)
-----   Message from Adrian Prows, [place] sent at 01/25/2021  6:45 PM EDT -----  Would you kindly let Carol Caldwell know that her vitamin D level is pretty low. I have sent in Rx for VitaminD supplement to be taken once a week for 2 months. She needs to make sure her PCP is ok with this and schedule a follow up  with her PCP In 6 weeks or so for testing to make sure levels have come up and plan next steps.     Thanks  Ss

## 2021-01-31 MED FILL — COSENTYX 300 MG/2 SYRINGES (150 MG/ML) SUBCUTANEOUS: SUBCUTANEOUS | 28 days supply | Qty: 2 | Fill #4

## 2021-02-01 ENCOUNTER — Ambulatory Visit: Admit: 2021-02-01 | Discharge: 2021-02-01 | Payer: MEDICARE

## 2021-02-01 NOTE — Unmapped (Signed)
Your physician today was Dr. Danella Maiers    You were seen by [facility] today.      [facility]  [address] Floor   [place], [place] [address]  Phone:  [phone]  Fax:  [phone]     Summary of Plan for 02/01/21    Today we discussed the following:    I think your Psoriatic Arthritis (PsA) looks a little active.  Some of your joint pain could be from osteoarthritis (OA).      Diagnostic testing recommendations:  X-rays today (on the first floor of the [employer])    Therapeutic / Treatment recommendations:   We talked about a couple different options for treatment.  One would be Enbrel or Cimzia (similar medicine to Humira).  Another option is a medicine called Stelara which is either a shot at home or an IV medicine.     Referrals and follow-up recommendations:    3 months with Carlus Pavlov PA-c, 8-9 months with Dr. Sullivan Lone    When should I expect results?   Please note that it may take up to 21 days for results to return.  If you have not been notified by phone, mail or [facility] (if applicable) in 21 days, please contact our office at [phone] or via [facility] messaging [email or web address])     What do I do if I have questions or concerns after the visit?   If you have non-urgent questions, the best way to contact your provider is to send a MyChart message by visiting [email or web address].  You can also use MyChart to request refills and access test results.  Providers will do their best to respond within 2-3 business days, although it may take longer in some circumstances.  If you have immediate concerns, please contact our clinic by phone  at [phone].     Thank you for allowing [facility] to be involved in your care!        Here is a link to info on Stelara:    [email or web address]    Ustekinumab (Stelara)    Ustekinumab (Stelara) is a biologic medication used to lower inflammation and help adult patients and patients six years or older with moderate to severe plaque psoriasis, adult patients with psoriatic arthritis, and adult patients with moderate to severe Crohn???s Disease and Ulcerative Colitis. This medicine is prescribed after non-responding to other medications. It is taken as a home injection. Ustekinumab blocks inflammation proteins called IL-12 and IL-23. Ustekinumab is a biologic medication and can take several months to start working. Biologics are also often taken long term.    How to Take It    Ustekinumab injection is initially given, then 4 weeks later, followed by every 12 weeks. It is given as an injection under the skin and can initially be given as an infusion in the vein followed by an injection under the skin in Crohn???s disease. The dose for patients weighing less than 100 kg (220 lbs) is 45 mg, and the dose for patients weighing more than 100 kg is 90 mg. You will need a screening test for tuberculosis before starting this medication to reduce risk of infection. You should follow up with your doctor regularly while on this medication.    Side Effects    Ustekinumab lowers your immune system and can lead to infections. Ustekinumab rarely can lead to a nervous system condition called reversible posterior leukoencephalopathy syndrome (RPLS), which is reversible  if the medication is stopped. Some people can get a reaction at the injection site, such as itching, swelling, and redness.    Tell Your Rheumatology Provider    If you develop symptoms of an infection, such as a fever or cough, you should contact your rheumatology provider. If you are pregnant or considering pregnancy, let your doctor know before starting this medication; ustekinumab has not been studied in pregnant or breastfeeding women. Be sure to talk with your rheumatology provider before receiving any vaccines or undergoing any surgeries while taking this medication. Live vaccines should be avoided while on this medication. Live vaccines contain a milder form of the virus or bacteria to help your body develop an immune response without you developing symptoms of the disease it is intended to prevent. These vaccines always carry a small chance a person could get the infection from the vaccine. Live vaccines include the nasal spray flu vaccine, and others such as the measles, mumps, rubella, and yellow fever vaccines. Tell your rheumatology provider right away if you have any new or worsening medical problems including: changes to your mental status, vision problems, seizures, or severe headaches.    Updated December 2020 by Fuller Song, DO, and reviewed by the [employer] of Rheumatology Communications and Marketing Committee.    This information is provided for general education only. Individuals should consult a qualified health care provider for professional medical advice, diagnosis and treatment of a medical or health condition.

## 2021-02-07 NOTE — Unmapped (Signed)
Sent letter to patient with the following information:     OA progressed some since 2018.

## 2021-02-12 DIAGNOSIS — L4050 Arthropathic psoriasis, unspecified: Principal | ICD-10-CM

## 2021-02-12 MED ORDER — STELARA 45 MG/0.5 ML SUBCUTANEOUS SOLUTION
SUBCUTANEOUS | 0 refills | 0.00000 days | Status: CP
Start: 2021-02-12 — End: ?

## 2021-02-12 NOTE — Unmapped (Signed)
Called pt to follow-up after her appt on 02/01/2021 and discuss next steps.  Today she tells me she continues to have good days and bad days.  Took last Cosentyx shot on 10/23, feeling better for a couple days after shot, but yesterday was a bad day.  She is interesting in [place] but a bit hesitant.  She agrees to have me send Rx to [facility] and she would like to talk to our clinic pharmacist about medication as well.  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      Danella Maiers, MD, MSCI  February 12, 2021   10:48 AM

## 2021-02-13 DIAGNOSIS — L4050 Arthropathic psoriasis, unspecified: Principal | ICD-10-CM

## 2021-02-15 NOTE — Unmapped (Signed)
[facility] Specialty Medication Onboarding    Specialty Medication: Stelara 45mg /0.56ml syringe  Prior Authorization: Approved   Financial Assistance: No - copay  <$25  Final Copay/Day Supply: $0 / 112 days (load) $0/ 84 days (maintenance)    Insurance Restrictions: None     Notes to Pharmacist:     The triage team has completed the benefits investigation and has determined that the patient is able to fill this medication at [facility]. Please contact the patient to complete the onboarding or follow up with the prescribing physician as needed.

## 2021-02-18 ENCOUNTER — Other Ambulatory Visit: Payer: Self-pay

## 2021-02-18 ENCOUNTER — Encounter (INDEPENDENT_AMBULATORY_CARE_PROVIDER_SITE_OTHER): Payer: Medicare HMO | Admitting: Ophthalmology

## 2021-02-18 DIAGNOSIS — E113292 Type 2 diabetes mellitus with mild nonproliferative diabetic retinopathy without macular edema, left eye: Secondary | ICD-10-CM

## 2021-02-18 DIAGNOSIS — H43813 Vitreous degeneration, bilateral: Secondary | ICD-10-CM | POA: Diagnosis not present

## 2021-02-18 DIAGNOSIS — E113311 Type 2 diabetes mellitus with moderate nonproliferative diabetic retinopathy with macular edema, right eye: Secondary | ICD-10-CM | POA: Diagnosis not present

## 2021-02-18 NOTE — Unmapped (Signed)
[facility]   Patient Onboarding/Medication Counseling    Switch from Cosentyx to [facility]     Carol Caldwell is a 65 y.o. female with psoriatic arthritis who I am counseling today on initiation of therapy.  I am speaking to the patient.    Was a Nurse, learning disability used for this call? No    Verified patient's date of birth / HIPAA.    Specialty medication(s) to be sent: Inflammatory Disorders: Stelara      Non-specialty medications/supplies to be sent: sharps container       Medications not needed at this time: n/a         Stelara (ustekinumab)    Medication & Administration     Dosage: Psoriatic arthritis: Inject 45mg  under the skin at weeks 0 and 4, then every 12 weeks thereafter    Lab tests required prior to treatment initiation:  ??? Tuberculosis: Tuberculosis screening resulted in a non-reactive Quantiferon TB Gold assay.    Administration:     Prefilled syringe  1. Gather all supplies needed for injection on a clean, flat working surface: medication syringe(s) removed from packaging, alcohol swab, sharps container, etc.  2. Look at the medication label - look for correct medication, correct dose, and check the expiration date  3. Look at the medication - the liquid in the syringe should appear clear and colorless to slightly yellow, you may see a few white particles  4. Lay the syringe on a flat surface and allow it to warm up to room temperature for at least 15-30 minutes  5. Select injection site - you can use the front of your thigh or your belly (but not the area 2 inches around your belly button); if someone else is giving you the injection you can also use your upper arm in the skin covering your triceps muscle or in the buttocks  6. Prepare injection site - wash your hands and clean the skin at the injection site with an alcohol swab and let it air dry, do not touch the injection site again before the injection  7. Pull off the needle safety cap, do not remove until immediately prior to injection  8. Pinch the skin - with your hand not holding the syringe pinch up a fold of skin at the injection site using your forefinger and thumb  9. Insert the needle into the fold of skin at about a 45 degree angle - it's best to use a quick dart-like motion  10. Push the plunger down slowly as far as it will go until the syringe is empty, if the plunger is not fully depressed the needle shield will not extend to cover the needle when it is removed, hold the syringe in place for a full 5 seconds  11. Check that the syringe is empty and keep pressing down on the plunger while you pull the needle out at the same angle as inserted; after the needle is removed completely from the skin, release the plunger allowing the needle shield to activate and cover the used needle  12. Dispose of the used syringe immediately in your sharps disposal container, do not attempt to recap the needle prior to disposing  13. If you see any blood at the injection site, press a cotton ball or gauze on the site and maintain pressure until the bleeding stops, do not rub the injection site      Adherence/Missed dose instructions:  If your injection is given more than 7-10 days after  your scheduled injection date - consult your pharmacist for additional instructions on how to adjust your dosing schedule.    Goals of Therapy       Psoriatic arthritis  ??? Achieve remission/inactive disease or low/minimal disease activity  ??? Maintenance of function  ??? Minimization of systemic manifestations and comorbidities  ??? Maintenance of effective psychosocial functioning          Side Effects & Monitoring Parameters     ??? Injection site reaction (redness, irritation, inflammation localized to the site of administration)  ??? Signs of a common cold - minor sore throat, runny or stuffy nose, etc.  ??? Feeling tired or weak  ??? Headache    The following side effects should be reported to the provider:  ??? Signs of a hypersensitivity reaction - rash; hives; itching; red, swollen, blistered, or peeling skin; wheezing; tightness in the chest or throat; difficulty breathing, swallowing, or talking; swelling of the mouth, face, lips, tongue, or throat; etc.  ??? Reduced immune function - report signs of infection such as fever; chills; body aches; very bad sore throat; ear or sinus pain; cough; more sputum or change in color of sputum; pain with passing urine; wound that will not heal, etc.  Also at a slightly higher risk of some malignancies (mainly skin and blood cancers) due to this reduced immune function.  o In the case of signs of infection - the patient should hold the next dose of Stelara?? and call your primary care provider to ensure adequate medical care.  Treatment may be resumed when infection is treated and patient is asymptomatic.  ??? Changes in skin - a new growth or lump that forms; changes in shape, size, or color of a previous mole or marking  ??? Shortness of breath or chest pain  ??? Vaginal itching or discharge      Contraindications, Warnings, & Precautions     ??? Have your bloodwork checked as you have been told by your prescriber  ??? Talk with your doctor if you are pregnant, planning to become pregnant, or breastfeeding  ??? Discuss the possible need for holding your dose(s) of Stelara?? when a planned procedure is scheduled with the prescriber as it may delay healing/recovery timeline       Drug/Food Interactions     ??? Medication list reviewed in Epic. The patient was instructed to inform the care team before taking any new medications or supplements. No drug interactions identified.   ??? If you have a latex allergy use caution when handling, the needle cap of the Stelara?? prefilled syringe contains a derivative of natural rubber latex  ??? Talk with you prescriber or pharmacist before receiving any live vaccinations while taking this medication and after you stop taking it    Storage, Handling Precautions, & Disposal     ??? Store this medication in the refrigerator.  Do not freeze  ??? If needed, you may store at room temperature for up to 30 days  ??? Store in original packaging, protected from light  ??? Do not shake  ??? Dispose of used syringes/pens in a sharps disposal container            Current Medications (including OTC/herbals), Comorbidities and Allergies     Current Outpatient Medications   Medication Sig Dispense Refill   ??? bromfenac 0.075 % Drop Apply to eye.     ??? DULoxetine (CYMBALTA) 30 MG capsule Take 30 mg by mouth daily.     ??? empagliflozin (JARDIANCE)  10 mg tablet Take 10 mg by mouth daily.     ??? empty container Misc Use as directed 1 each 2   ??? ergocalciferol-1,250 mcg, 50,000 unit, (DRISDOL) 1,250 mcg (50,000 unit) capsule Take 1 capsule (1,250 mcg total) by mouth once a week. 4 capsule 2   ??? furosemide (LASIX) 40 MG tablet Take 1 tablet (40 mg total) by mouth every other day. (Patient not taking: Reported on 02/01/2021) 45 tablet 3   ??? gabapentin (NEURONTIN) 300 MG capsule Take 600 mg by mouth Three (3) times a day.     ??? metFORMIN (GLUCOPHAGE) 1000 MG tablet Take 1,000 mg by mouth in the morning and 1,000 mg in the evening. Take with meals.     ??? polymyxin B sulf-trimethoprim (POLYTRIM) 10,000 unit- 1 mg/mL Drop      ??? rosuvastatin (CRESTOR) 20 MG tablet Take 1 tablet (20 mg total) by mouth daily. 90 tablet 3   ??? secukinumab 150 mg/mL Syrg Inject the contents of 2 syringes (300 mg) under the skin every twenty-eight (28) days. 6 mL 3   ??? ustekinumab (STELARA) 45 mg/0.5 mL Soln Inject the contents of 1 syringe (45 mg) under the skin at week 0 and 4, then 1 syringe (45mg ) every 12 weeks thereafter as maintenance. 1 mL 0   ??? ustekinumab (STELARA) 45 mg/0.5 mL Soln Inject the contents of 1 syringe (45 mg) under the skin every 12 weeks. 1 mL 3   ??? vit C,E-Zn-coppr-lutein-zeaxan (PRESERVISION AREDS-2) 250-90-40-1 mg cap Take by mouth Two (2) times a day.       No current facility-administered medications for this visit.       No Known Allergies    Patient Active Problem List   Diagnosis   ??? Controlled type 2 diabetes mellitus, without long-term current use of insulin (CMS-HCC)   ??? Psoriatic arthritis (CMS-HCC)   ??? Bilateral lower extremity edema   ??? Syncope       Reviewed and up to date in Epic.    Appropriateness of Therapy     Acute infections noted within Epic:  No active infections  Patient reported infection: None    Is medication and dose appropriate based on diagnosis and infection status? Yes    Prescription has been clinically reviewed: Yes      Baseline Quality of Life Assessment      How many days over the past month did your psoriatic arthritis  keep you from your normal activities? For example, brushing your teeth or getting up in the morning. flares up when the injection wears off 2 weeks after Cosentyx injection.  Cramps/lower back pain/shoulder/hands pain    Financial Information     Medication Assistance provided: Prior Authorization    Anticipated copay of $0 reviewed with patient. Verified delivery address.    Delivery Information     Scheduled delivery date: 11/17    Expected start date: 11/20    Medication will be delivered via Same Day Courier to the prescription address in [facility].  This shipment will not require a signature.      Explained the services we provide at [facility] and that each month we would call to set up refills.  Stressed importance of returning phone calls so that we could ensure they receive their medications in time each month.  Informed patient that we should be setting up refills 7-10 days prior to when they will run out of medication.  A pharmacist will reach out  to perform a clinical assessment periodically.  Informed patient that a welcome packet, containing information about our pharmacy and other support services, a Notice of Privacy Practices, and a drug information handout will be sent.      The patient or caregiver noted above participated in the development of this care plan and knows that they can request review of or adjustments to the care plan at any time.      Patient or caregiver verbalized understanding of the above information as well as how to contact the pharmacy at [phone] option 4 with any questions/concerns.  The pharmacy is open Monday through Friday 8:30am-4:30pm.  A pharmacist is available 24/7 via pager to answer any clinical questions they may have.    Patient Specific Needs     - Does the patient have any physical, cognitive, or cultural barriers? No    - Does the patient have adequate living arrangements? (i.e. the ability to store and take their medication appropriately) Yes    - Did you identify any home environmental safety or security hazards? No    - Patient prefers to have medications discussed with  Patient     - Is the patient or caregiver able to read and understand education materials at a high school level or above? Yes    - Patient's primary language is  English     - Is the patient high risk? No    - Does the patient require physician intervention or other additional services (i.e. dietary/nutrition, smoking cessation, social work)? No      Carol Caldwell  [facility] Specialty Pharmacist

## 2021-02-26 DIAGNOSIS — L4050 Arthropathic psoriasis, unspecified: Principal | ICD-10-CM

## 2021-02-26 MED ORDER — USTEKINUMAB 45 MG/0.5 ML SUBCUTANEOUS SYRINGE
SUBCUTANEOUS | 3 refills | 0.00000 days | Status: CP
Start: 2021-02-26 — End: ?

## 2021-02-26 NOTE — Unmapped (Signed)
Clinical Assessment Needed For: Formulation Change  Medication: Stelara syringe 45mg /0.72ml  Copay $0  Was previous dose already scheduled to fill: Yes    Notes to Pharmacist: patient was counseled on syringes already

## 2021-02-27 DIAGNOSIS — L4050 Arthropathic psoriasis, unspecified: Principal | ICD-10-CM

## 2021-02-27 MED ORDER — USTEKINUMAB 45 MG/0.5 ML SUBCUTANEOUS SYRINGE
SUBCUTANEOUS | 0 refills | 56.00000 days | Status: CP
Start: 2021-02-27 — End: ?
  Filled 2021-02-28: qty 0.5, 28d supply, fill #0

## 2021-02-28 MED FILL — EMPTY CONTAINER: 120 days supply | Qty: 1 | Fill #1

## 2021-03-18 ENCOUNTER — Other Ambulatory Visit: Payer: Self-pay

## 2021-03-18 ENCOUNTER — Encounter (INDEPENDENT_AMBULATORY_CARE_PROVIDER_SITE_OTHER): Payer: Medicare HMO | Admitting: Ophthalmology

## 2021-03-18 DIAGNOSIS — H43813 Vitreous degeneration, bilateral: Secondary | ICD-10-CM | POA: Diagnosis not present

## 2021-03-18 DIAGNOSIS — E113311 Type 2 diabetes mellitus with moderate nonproliferative diabetic retinopathy with macular edema, right eye: Secondary | ICD-10-CM

## 2021-03-18 DIAGNOSIS — E113392 Type 2 diabetes mellitus with moderate nonproliferative diabetic retinopathy without macular edema, left eye: Secondary | ICD-10-CM

## 2021-03-19 NOTE — Unmapped (Signed)
[facility] Clinical Assessment & Refill Coordination Note    Carol Caldwell, DOB: 24-Mar-1956  Phone: [phone] (home)     All above HIPAA information was verified with patient.     Was a Nurse, learning disability used for this call? No    Specialty Medication(s):   Inflammatory Disorders: Stelara     Current Outpatient Medications   Medication Sig Dispense Refill   ??? bromfenac 0.075 % Drop Apply to eye.     ??? DULoxetine (CYMBALTA) 30 MG capsule Take 30 mg by mouth daily.     ??? empagliflozin (JARDIANCE) 10 mg tablet Take 10 mg by mouth daily.     ??? empty container Misc Use as directed 1 each 2   ??? ergocalciferol-1,250 mcg, 50,000 unit, (DRISDOL) 1,250 mcg (50,000 unit) capsule Take 1 capsule (1,250 mcg total) by mouth once a week. 4 capsule 2   ??? furosemide (LASIX) 40 MG tablet Take 1 tablet (40 mg total) by mouth every other day. (Patient not taking: Reported on 02/01/2021) 45 tablet 3   ??? gabapentin (NEURONTIN) 300 MG capsule Take 600 mg by mouth Three (3) times a day.     ??? metFORMIN (GLUCOPHAGE) 1000 MG tablet Take 1,000 mg by mouth in the morning and 1,000 mg in the evening. Take with meals.     ??? polymyxin B sulf-trimethoprim (POLYTRIM) 10,000 unit- 1 mg/mL Drop      ??? rosuvastatin (CRESTOR) 20 MG tablet Take 1 tablet (20 mg total) by mouth daily. 90 tablet 3   ??? ustekinumab (STELARA) 45 mg/0.5 mL Syrg syringe Inject the contents of 1 syringe (45mg ) under the skin on week 0 and 4 as loading doses. 1 mL 0   ??? ustekinumab (STELARA) 45 mg/0.5 mL Syrg syringe Stelara 45 mg subcutaneous once every 12 weeks as maintenance dose after initial 2 loading doses 0.5 mL 3   ??? ustekinumab (STELARA) 45 mg/0.5 mL Syrg syringe Inject the contents of 1 syringe (45mg ) under the skin on week 0 and 4 as loading doses. 1 mL 0   ??? ustekinumab (STELARA) 45 mg/0.5 mL Syrg syringe Inject the contents of 1 syringe (45 mg total) under the skin every 84 days as maintenance. 0.5 mL 3   ??? vit C,E-Zn-coppr-lutein-zeaxan (PRESERVISION AREDS-2) 250-90-40-1 mg cap Take by mouth Two (2) times a day.       No current facility-administered medications for this visit.        Changes to medications: Carol Caldwell reports no changes at this time.    No Known Allergies    Changes to allergies: No    SPECIALTY MEDICATION ADHERENCE       Medication Adherence    Patient reported X missed doses in the last month: 0  Specialty Medication: Stelara 45mg   Patient is on additional specialty medications: No  Informant: patient          Specialty medication(s) dose(s) confirmed: Regimen is correct and unchanged.     Are there any concerns with adherence? No    Adherence counseling provided? Not needed    CLINICAL MANAGEMENT AND INTERVENTION      Clinical Benefit Assessment:    Do you feel the medicine is effective or helping your condition? Yes    Clinical Benefit counseling provided? Not needed    Adverse Effects Assessment:    Are you experiencing any side effects? No    Are you experiencing difficulty administering your medicine? No    Quality of Life Assessment:  Quality of Life    Rheumatology  1. What impact has your specialty medication had on the reduction of your daily pain level?: Some  2. What impact has your specialty medication had on your ability to complete daily tasks (prepare meals, get dressed, etc...)?Marland Kitchen Minimal  Oncology  Dermatology  Cystic Fibrosis              Have you discussed this with your provider? Not needed    Acute Infection Status:    Acute infections noted within Epic:  No active infections  Patient reported infection: None    Therapy Appropriateness:    Is therapy appropriate and patient progressing towards therapeutic goals? Yes, therapy is appropriate and should be continued    DISEASE/MEDICATION-SPECIFIC INFORMATION      For patients on injectable medications: Patient currently has 0 doses left.  Next injection is scheduled for 12/18.    PATIENT SPECIFIC NEEDS     - Does the patient have any physical, cognitive, or cultural barriers? No    - Is the patient high risk? No    - Does the patient require a Care Management Plan? No     SOCIAL DETERMINANTS OF HEALTH     At the [facility], we have learned that life circumstances - like trouble affording food, housing, utilities, or transportation can affect the health of many of our patients.   That is why we wanted to ask: are you currently experiencing any life circumstances that are negatively impacting your health and/or quality of life? No    Social Determinants of Health     Food Insecurity: Not on file   Tobacco Use: Low Risk    ??? Smoking Tobacco Use: Never   ??? Smokeless Tobacco Use: Never   ??? Passive Exposure: Not on file   Transportation Needs: Not on file   Alcohol Use: Not on file   Housing/Utilities: Not on file   Substance Use: Not on file   Financial Resource Strain: Not on file   Physical Activity: Not on file   Health Literacy: Not on file   Stress: Not on file   Intimate Partner Violence: Not on file   Depression: Not on file   Social Connections: Not on file       Would you be willing to receive help with any of the needs that you have identified today? No       SHIPPING     Specialty Medication(s) to be Shipped:   Inflammatory Disorders: Stelara    Other medication(s) to be shipped: No additional medications requested for fill at this time     Changes to insurance: No    Delivery Scheduled: Yes, Expected medication delivery date: 12/13.     Medication will be delivered via Same Day Courier to the confirmed prescription address in [facility].    The patient will receive a drug information handout for each medication shipped and additional FDA Medication Guides as required.  Verified that patient has previously received a Conservation officer, historic buildings and a Surveyor, mining.    The patient or caregiver noted above participated in the development of this care plan and knows that they can request review of or adjustments to the care plan at any time.      All of the patient's questions and concerns have been addressed.    Julianne Rice   [facility] Specialty Pharmacist

## 2021-03-26 MED FILL — STELARA 45 MG/0.5 ML SUBCUTANEOUS SYRINGE: 28 days supply | Qty: 0.5 | Fill #1

## 2021-04-15 ENCOUNTER — Encounter (INDEPENDENT_AMBULATORY_CARE_PROVIDER_SITE_OTHER): Payer: Medicare HMO | Admitting: Ophthalmology

## 2021-04-29 ENCOUNTER — Encounter (INDEPENDENT_AMBULATORY_CARE_PROVIDER_SITE_OTHER): Payer: Medicare HMO | Admitting: Ophthalmology

## 2021-04-29 ENCOUNTER — Other Ambulatory Visit: Payer: Self-pay

## 2021-04-29 DIAGNOSIS — H43813 Vitreous degeneration, bilateral: Secondary | ICD-10-CM | POA: Diagnosis not present

## 2021-04-29 DIAGNOSIS — E113311 Type 2 diabetes mellitus with moderate nonproliferative diabetic retinopathy with macular edema, right eye: Secondary | ICD-10-CM | POA: Diagnosis not present

## 2021-04-29 DIAGNOSIS — E113292 Type 2 diabetes mellitus with mild nonproliferative diabetic retinopathy without macular edema, left eye: Secondary | ICD-10-CM | POA: Diagnosis not present

## 2021-05-15 ENCOUNTER — Ambulatory Visit: Admit: 2021-05-15 | Discharge: 2021-05-15 | Payer: MEDICARE

## 2021-05-15 DIAGNOSIS — L4050 Arthropathic psoriasis, unspecified: Principal | ICD-10-CM

## 2021-05-15 DIAGNOSIS — M654 Radial styloid tenosynovitis [de Quervain]: Principal | ICD-10-CM

## 2021-05-15 MED ORDER — DICLOFENAC 1 % TOPICAL GEL
Freq: Four times a day (QID) | TOPICAL | 3 refills | 13.00000 days | Status: CP
Start: 2021-05-15 — End: ?

## 2021-05-15 NOTE — Unmapped (Signed)
REASON FOR VISIT: f/u psoriatic arthritis    HISTORY: Carol Caldwell is a 67 y.o. 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   Treatment history:  - We have avoided mtx/leflunomide due to alcohol use.   - Humira started 09/2016  - Switched to cosentyx 150 mg 06/2020  - Cosentyx increased to 300 mg q 28 days in 09/2020  - Cosentyx changed to stelara in 02/2021 due to persistent back pain  - Current med regimen: stelara 45 mg q 3 mo.     Interim history:   Pt presents for f/u.     Has seen improvement with the switch to stelara. Has had the first 2 loading doses.   Improvement in back pain. Still pain in the L thumb and and shins. Has tingling in the legs.  She has diabetes, which is well controlled per pt. Discussed the pain in the shins and tingling in legs more likely due to neuropathy. Could be related to diabetes, though this is unclear. Recommend f/u with PCP for this.   AM stiffness now lasting about 5 min.   No psoriasis on skin.     In review of labs available, noted to have low serum vit D. Cardiology prescribed weekly ergocalciferol, then recommended f/u with PCP. She has completed ergocalciferol. Taking OTC supplements now, but not sure of dose. We discussed f/u with PCP for this.     CURRENT MEDICATIONS:  Current Outpatient Medications   Medication Sig Dispense Refill   ??? benzonatate (TESSALON) 200 MG capsule      ??? bromfenac 0.075 % Drop Apply to eye.     ??? DULoxetine (CYMBALTA) 30 MG capsule Take 30 mg by mouth daily.     ??? empty container Misc Use as directed 1 each 2   ??? gabapentin (NEURONTIN) 300 MG capsule Take 600 mg by mouth Three (3) times a day.     ??? polymyxin B sulf-trimethoprim (POLYTRIM) 10,000 unit- 1 mg/mL Drop      ??? rosuvastatin (CRESTOR) 20 MG tablet Take 1 tablet (20 mg total) by mouth daily. 90 tablet 3   ??? ustekinumab (STELARA) 45 mg/0.5 mL Syrg syringe Stelara 45 mg subcutaneous once every 12 weeks as maintenance dose after initial 2 loading doses 0.5 mL 3   ??? vit C,E-Zn-coppr-lutein-zeaxan (PRESERVISION AREDS-2) 250-90-40-1 mg cap Take by mouth Two (2) times a day.     ??? empagliflozin (JARDIANCE) 10 mg tablet Take 10 mg by mouth daily. (Patient not taking: Reported on 05/15/2021)     ??? ergocalciferol-1,250 mcg, 50,000 unit, (DRISDOL) 1,250 mcg (50,000 unit) capsule Take 1 capsule (1,250 mcg total) by mouth once a week. (Patient not taking: Reported on 05/15/2021) 4 capsule 2   ??? furosemide (LASIX) 40 MG tablet Take 1 tablet (40 mg total) by mouth every other day. (Patient not taking: Reported on 02/01/2021) 45 tablet 3   ??? metFORMIN (GLUCOPHAGE) 1000 MG tablet Take 1,000 mg by mouth in the morning and 1,000 mg in the evening. Take with meals. (Patient not taking: Reported on 05/15/2021)       No current facility-administered medications for this visit.       Past Medical History:   Diagnosis Date   ??? Diabetes mellitus (CMS-HCC)    ??? Psoriasis    ??? Psoriatic arthritis (CMS-HCC)         Record Review: Available records were reviewed, including pertinent office visits, labs, and imaging.      REVIEW OF SYSTEMS:  Ten system were reviewed and negative except as noted above.    PHYSICAL EXAM:  Vitals:    05/15/21 1011   BP: 143/90   Pulse: 91   Temp: 36.2 ??C (97.2 ??F)   TempSrc: Temporal   Weight: 74.4 kg (164 lb)      General:   Pleasant 66 y.o.female in no acute distress, WDWN   Cardiovascular:  Regular rate and rhythm. No murmur, rub, or gallop. No lower extremity edema.    Lungs:  Clear to auscultation.Normal respiratory effort.    Musculoskeletal:   General: Ambulates w/o assistance   Hands: No swelling.  Able to make a tight fist b/l. Tenderness over R thumb MCP and CMC and +finklestein on R.   Wrists:FROM w/o swelling or tenderness   Elbows: Slight flexion contracture of R w/o swelling.   Shoulders: painful and limited ROM b/l   Spine: Modified Schober with 4 cm difference.  Occiput wall 0 cm. +FABER b/l. Tenderness midline Lspine.   Hips: Reduced external rotation bilaterally L>R.   Knees: Reduced flexion with pain bilaterally.  No effusions.  Ankles: no swelling or tenderness   Feet: No pain with MTP squeeze    Psych:  Appropriate affect and mood   Skin:  No rashes.         ASSESSMENT/PLAN:  1. Psoriatic arthritis (CMS-HCC)  Improved with change to stelara. She has not been on this very long, and may continue to see further improvement.   Continue stelara 45 mg q 3 mo injections.     2. Tendinitis, de Quervain's, R thumb  Voltaren gel QID and thumb spica splint. She was given instructions on how to purchase a thumb spica splint.   - diclofenac sodium (VOLTAREN) 1 % gel; Apply 2 g topically four (4) times a day.  Dispense: 100 g; Refill: 3        HCM:   - pneumonia vaccine: PCV20 10/03/20  - COVID-19 vaccine status: Pfizer 11/16/2019, 12/22/2019, 04/19/2020. Pfizer bivalent booster 02/01/21  - Annual Influenza vaccine. Status: 06/28/2020  - Bone health: not on prednisone       RTC 7 mo with Dr Sullivan Lone. Offered appt in between with myself, but she feels she does not need this.   Greater than 30 minutes spent in visit with patient, including pre and postvisit activities.

## 2021-05-15 NOTE — Unmapped (Signed)
Recommend getting a thumb spica splint. Looks like this:

## 2021-06-10 ENCOUNTER — Other Ambulatory Visit: Payer: Self-pay

## 2021-06-10 ENCOUNTER — Encounter (INDEPENDENT_AMBULATORY_CARE_PROVIDER_SITE_OTHER): Payer: Medicare HMO | Admitting: Ophthalmology

## 2021-06-10 DIAGNOSIS — H35033 Hypertensive retinopathy, bilateral: Secondary | ICD-10-CM | POA: Diagnosis not present

## 2021-06-10 DIAGNOSIS — I10 Essential (primary) hypertension: Secondary | ICD-10-CM | POA: Diagnosis not present

## 2021-06-10 DIAGNOSIS — H43813 Vitreous degeneration, bilateral: Secondary | ICD-10-CM | POA: Diagnosis not present

## 2021-06-10 DIAGNOSIS — E113311 Type 2 diabetes mellitus with moderate nonproliferative diabetic retinopathy with macular edema, right eye: Secondary | ICD-10-CM | POA: Diagnosis not present

## 2021-06-10 DIAGNOSIS — E113392 Type 2 diabetes mellitus with moderate nonproliferative diabetic retinopathy without macular edema, left eye: Secondary | ICD-10-CM | POA: Diagnosis not present

## 2021-06-21 NOTE — Unmapped (Signed)
[facility] Refill Coordination Note    Specialty Medication(s) to be Shipped:   Inflammatory Disorders: Stelara    Other medication(s) to be shipped: No additional medications requested for fill at this time     Carol Caldwell, DOB: June 07, 1955  Phone: [phone] (home)       All above HIPAA information was verified with patient.     Was a Nurse, learning disability used for this call? No    Completed refill call assessment today to schedule patient's medication shipment from the [facility] [phone]).  All relevant notes have been reviewed.     Specialty medication(s) and dose(s) confirmed: Regimen is correct and unchanged.   Changes to medications: Carol Caldwell reports no changes at this time.  Changes to insurance: No  New side effects reported not previously addressed with a pharmacist or physician: None reported  Questions for the pharmacist: No    Confirmed patient received a Conservation officer, historic buildings and a Surveyor, mining with first shipment. The patient will receive a drug information handout for each medication shipped and additional FDA Medication Guides as required.       DISEASE/MEDICATION-SPECIFIC INFORMATION        For patients on injectable medications: Patient currently has 0 doses left.  Next injection is scheduled for 3/14.    SPECIALTY MEDICATION ADHERENCE     Medication Adherence    Patient reported X missed doses in the last month: 0  Specialty Medication: Stelara  Patient is on additional specialty medications: No  Patient is on more than two specialty medications: No  Any gaps in refill history greater than 2 weeks in the last 3 months: no  Demonstrates understanding of importance of adherence: yes  Informant: patient              Were doses missed due to medication being on hold? No    Stelara 45mg /0.56ml: Patient has 0 days of medication on hand    REFERRAL TO PHARMACIST     Referral to the pharmacist: Not needed      [facility]     Shipping address confirmed in Epic. Delivery Scheduled: Yes, Expected medication delivery date: 3/14.     Medication will be delivered via Same Day Courier to the prescription address in Epic WAM.    Carol Caldwell   [facility] Specialty Technician

## 2021-06-25 MED FILL — STELARA 45 MG/0.5 ML SUBCUTANEOUS SYRINGE: 28 days supply | Qty: 0.5 | Fill #0

## 2021-07-08 ENCOUNTER — Other Ambulatory Visit: Payer: Self-pay

## 2021-07-08 ENCOUNTER — Encounter (INDEPENDENT_AMBULATORY_CARE_PROVIDER_SITE_OTHER): Payer: Medicare HMO | Admitting: Ophthalmology

## 2021-07-08 DIAGNOSIS — H43813 Vitreous degeneration, bilateral: Secondary | ICD-10-CM | POA: Diagnosis not present

## 2021-07-08 DIAGNOSIS — E113392 Type 2 diabetes mellitus with moderate nonproliferative diabetic retinopathy without macular edema, left eye: Secondary | ICD-10-CM | POA: Diagnosis not present

## 2021-07-08 DIAGNOSIS — E113311 Type 2 diabetes mellitus with moderate nonproliferative diabetic retinopathy with macular edema, right eye: Secondary | ICD-10-CM | POA: Diagnosis not present

## 2021-07-24 NOTE — Unmapped (Unsigned)
Assessment and Plan:   Carol Caldwell is a 66 y.o. female with a history of diabetes, psoriatic arthritis, LE edema,  who presents in clinic today for follow-up.       PVP _ 6 months follow up me     Notes from previous visit.     1. Edema  Stable with change from daily to prn lasix in setting of addition of Jardiance to her DM regimen.   Has had improvement in orthostasis and cramping since change  Reviewd most recent labs from July showing stable Cr;  BMP from January showed normal 'lytes.   Repeat CMP today.     2. DMII, elevated ASCVD risk, HLD  Lipids July'22 - no statin - showed TC 221, TG 88, HDL 53, LDL 150, non HDL 168.   Rosuvastatin was started at last visit. Tolerating well.   Repeat lipids today.   If lpids at goal, will continue current dose.     3. Cramping   LE cramping improved with change in lasix from daily to prn.  Continues with chronic intermittent L sided cramping she has been told is 2/2 nephrolithiasis. Neither are worse with initiation of statin.   Will check electrolytes, Vit D  as below. Recommend follow up with PCP.       No orders of the defined types were placed in this encounter.      Requested Prescriptions      No prescriptions requested or ordered in this encounter         No follow-ups on file.              Subjective:   PCP: [facility]  Patient: Carol Caldwell  DOB: [birth year]    Reason for visit: 80mo follow up   Edema  HPI: SARA Caldwell is a 66 y.o. female with a history of diabetes who presents in clinic today for follow-up.      Notes from previous visit.     At last visit with me 3 months ago, rosuvastatin was initiated and lasix was changed to PRN in setting of recent addition of jardiance to her DM regimen.  She has been doing better since then.   Edema is stable and well controlled. Hasn't needed lasix.   Reports rare episodic, non exertional  shortness of breath w/o associated symptoms or clear precipitant. Says it doesn't bother her. Continues with episodic, mild, non exertional chest discomfort w/o associated symptoms or clear precipitant. Stable for a few years. Again, doesn't bother her much.     Tolerating rosuvastatin w/o difficulty.   Leg cramps are not as bad as at last visit.   For years she has had occasional cramping in her L side. Has been told it was 2/2 kidney stone. Can be severe pain. No trigger. No hematuria.     Admits to occasional orthostatic lightheadedness which has improved with stopping daily lasix. No presyncope/syncope or falls.     No dedicated exercise but is active caring for her adult disabled son. She also lives with her daughter and brother.           ______________________________________________________________________    Pertinent Medical History, Cardiovascular History & Procedures:    Pertinent PMH:  Diabetes  Psoriatic arthritis  Vit D Deficiency     Cath / PCI:  None    CV Surgery:   None    EP Procedures and Devices:  None    Non-Invasive Evaluation(s):  Myocardial perfusion study  7/19: Equivocal due to marked adjacent gut activity overlying inferolateral segments.  No significant ischemia is appreciated involving any other segments  Stress echocardiogram 1/19: No echocardiographic evidence of inducible ischemia but patient only exercised 3 minutes and 6 seconds on Bruce protocol  6/18: Normal EF, aortic sclerosis, dilated PA, mild pulmonary hypertension    ______________________________________________________________________    Other past medical history, social history, family history, medications, allergies and problem list reviewed in the medical record.    Current cardiac medications include:  Furosemide 40 mg every day  prn   Jardiance 10mg   ( rx'd for DM)   Rosuvastatin 10mg  every day       Objective:     There were no vitals taken for this visit.     Wt Readings from Last 3 Encounters:   05/15/21 74.4 kg (164 lb)   02/01/21 72.6 kg (160 lb)   01/23/21 75.8 kg (167 lb)           PHYSICAL EXAMINATION:   GENERAL:  Alert, NAD  ENT: Wearing a mask  CARDIOVASCULAR:  Regular rate and rhythm with rare ectopy , normal S1/S2, no murmurs, rubs, or gallops.  No significant edema.  No carotid bruit.  RESPIRATORY:  Clear to auscultation bilaterally.  No wheezes, crackles, or rhonchi. Normal work of breathing.        ______________________________________________________________________    EKG 04/23/20 shows normal sinus rhythm, normal axis, and no diagnostic ischemic changes.       Recent CV pertinent labs:  Lab Results   Component Value Date    LDL Calculated 100 (H) 01/23/2021    Non-HDL Cholesterol 129 01/23/2021    HDL 49 01/23/2021    Creatinine 0.99 (H) 01/23/2021    Creatinine 0.8 10/04/2018    Creatinine Whole Blood, POC 0.7 05/16/2016    Potassium 4.8 01/23/2021    Potassium 3.8 10/04/2018    BUN 19 01/23/2021    BUN 16.00 10/04/2018

## 2021-08-05 ENCOUNTER — Encounter (INDEPENDENT_AMBULATORY_CARE_PROVIDER_SITE_OTHER): Payer: Medicare HMO | Admitting: Ophthalmology

## 2021-08-05 DIAGNOSIS — E113311 Type 2 diabetes mellitus with moderate nonproliferative diabetic retinopathy with macular edema, right eye: Secondary | ICD-10-CM | POA: Diagnosis not present

## 2021-08-05 DIAGNOSIS — E113392 Type 2 diabetes mellitus with moderate nonproliferative diabetic retinopathy without macular edema, left eye: Secondary | ICD-10-CM

## 2021-08-05 DIAGNOSIS — H43813 Vitreous degeneration, bilateral: Secondary | ICD-10-CM | POA: Diagnosis not present

## 2021-08-16 ENCOUNTER — Ambulatory Visit: Admit: 2021-08-16 | Discharge: 2021-08-17 | Payer: MEDICARE

## 2021-08-16 DIAGNOSIS — N1831 Type 2 diabetes mellitus with stage 3a chronic kidney disease, without long-term current use of insulin (CMS-HCC): Principal | ICD-10-CM

## 2021-08-16 DIAGNOSIS — R609 Edema, unspecified: Principal | ICD-10-CM

## 2021-08-16 DIAGNOSIS — E1122 Type 2 diabetes mellitus with diabetic chronic kidney disease: Principal | ICD-10-CM

## 2021-08-16 DIAGNOSIS — Z5181 Encounter for therapeutic drug level monitoring: Principal | ICD-10-CM

## 2021-08-16 DIAGNOSIS — E785 Hyperlipidemia, unspecified: Principal | ICD-10-CM

## 2021-08-16 DIAGNOSIS — R000 Tachycardia, unspecified: Principal | ICD-10-CM

## 2021-08-16 LAB — COMPREHENSIVE METABOLIC PANEL
ALBUMIN/GLOBULIN RATIO: 0.7 — ABNORMAL LOW (ref 1.1–2.2)
ALBUMIN: 3.5 g/dL (ref 3.5–5.0)
ALKALINE PHOSPHATASE: 66 U/L (ref 50–136)
ALT (SGPT): 26 U/L (ref 12–78)
ANION GAP: 13 mmol/L
AST (SGOT): 27 U/L (ref 7–31)
BILIRUBIN TOTAL: 0.5 mg/dL (ref 0.0–1.5)
BLOOD UREA NITROGEN: 22 mg/dL (ref 5–26)
BUN / CREAT RATIO: 18 (ref 12–25)
CALCIUM: 8.9 mg/dL (ref 8.5–10.5)
CHLORIDE: 101 mmol/L (ref 95–110)
CO2: 25.5 mmol/L (ref 21.0–31.0)
CREATININE: 1.19 mg/dL (ref 0.50–1.50)
EGFR CKD-EPI (2021) FEMALE: 51 mL/min/{1.73_m2} — ABNORMAL LOW (ref >=60)
GLOBULIN, TOTAL: 4.7 g/dL — ABNORMAL HIGH (ref 1.5–4.3)
GLUCOSE RANDOM: 135 mg/dL — ABNORMAL HIGH (ref 60–100)
POTASSIUM: 3.6 mmol/L (ref 3.5–5.1)
PROTEIN TOTAL: 8.2 g/dL (ref 6.4–8.3)
SODIUM: 139 mmol/L (ref 136–145)

## 2021-08-16 LAB — CBC
HEMATOCRIT: 38.5 % (ref 35.0–45.0)
HEMOGLOBIN: 11.7 g/dL (ref 11.3–15.5)
MEAN CORPUSCULAR HEMOGLOBIN CONC: 30.5 g/dL — ABNORMAL LOW (ref 31.0–36.0)
MEAN CORPUSCULAR HEMOGLOBIN: 28.8 pg (ref 26.0–32.0)
MEAN CORPUSCULAR VOLUME: 94 fL (ref 89.0–97.0)
MEAN PLATELET VOLUME: 7.6 fL (ref 6.5–8.9)
PLATELET COUNT: 264 10*9/L (ref 140–440)
RED BLOOD CELL COUNT: 4.08 10*12/L (ref 3.80–5.10)
RED CELL DISTRIBUTION WIDTH: 14.8 % (ref 11.0–15.0)
WBC ADJUSTED: 4.7 10*9/L (ref 3.8–10.8)

## 2021-08-16 LAB — HEMOGLOBIN A1C
ESTIMATED AVERAGE GLUCOSE: 171 mg/dL
HEMOGLOBIN A1C: 7.6 % — ABNORMAL HIGH (ref 4.8–5.6)

## 2021-08-16 LAB — TSH: THYROID STIMULATING HORMONE: 1.444 u[IU]/mL (ref 0.550–4.780)

## 2021-08-16 MED ORDER — POLYMYXIN B SULFATE 10,000 UNIT-TRIMETHOPRIM 1 MG/ML EYE DROPS
OPHTHALMIC | 0 refills | 34 days | Status: CP
Start: 2021-08-16 — End: ?

## 2021-08-16 NOTE — Unmapped (Addendum)
Assessment and Plan:   Carol Caldwell is a 66 y.o. female with a history of diabetes, psoriatic arthritis, LE edema,  who presents in clinic today for follow-up.    1. Edema  Stable with change from daily to prn lasix in setting of addition of Jardiance to her DM regimen.   Though she occasionally still has some orthostatic lightheadedness, it is overall better with this change.   Reviewd most recent labs from July showing stable Cr;  BMP from October showed normal 'lytes and renal function.   Repeat CMP today.     2. DMII, elevated ASCVD risk, HLD  Tolerated initiation of and subsequent uptitration of rosuvastatin to 20mg  last visit. Was to have had follow up lipids in January but this wasn't done here. Will obtain lab results from her PCP.     3. Tachycardia   She is mildly tachycardic today. Normotensive in office.   EKG with ST. Exam benign. Afebrile. Asymptomatic.  Check labs as below.  She will call with BP and HR readings early next week.     5. L eye conjunctivitis   Symptoms and time frame c/w with bacterial conjunctivitis from her grandson.   Says she will be unable to get appt with pcp today.   Rx'd polytrim as below. Reviewed s/s for which she should seek medical care over the weekend ( vision changes, pain, fever, HA, etc). She is advised to follow up with PCP Monday.       Orders Placed This Encounter   Procedures    Comprehensive Metabolic Panel    CBC    Hemoglobin A1c    TSH    ECG 12 Lead       Requested Prescriptions     Signed Prescriptions Disp Refills    polymyxin B sulf-trimethoprim (POLYTRIM) 10,000 unit- 1 mg/mL Drop 10 mL 0     Sig: Administer 1 drop into the left eye six (6) times a day.         Return in about 6 months (around 02/16/2022) for with JJK;  labs today .              Subjective:   [facility]: [facility]  Patient: Carol Caldwell  DOB: [birth year]    Reason for visit: 64mo follow up   Edema  HPI: Carol Caldwell is a 66 y.o. female with a history of diabetes who presents in clinic today for follow-up.    Feels well from cardiovascular perspective.   Edema is well controlled with furosemide use 1-2 times a month. Last used it 4 days ago.     Continues with episodic, mild, non exertional chest discomfort w/o associated symptoms or clear precipitant. Stable for a few years. Doesn't bother her much.     Reports occasional orthostatic lightheadedness. No presyncope/syncope or falls.     She has L eye redness, itching, mucus discharge for a couple of days.  No vision changes, photophobia, HA, fever. Grandson with conjunctivitis.     No dedicated exercise but is active caring for her adult disabled son. She also lives with her daughter and brother.   Denies any shortness of breath, palpitations, PND, orthopnea, or other pertinent complaints.     ______________________________________________________________    Pertinent Medical History, Cardiovascular History & Procedures:    Pertinent PMH:  Diabetes  Psoriatic arthritis  Vit D Deficiency     Cath / PCI:  None    CV Surgery:   None  EP Procedures and Devices:  None    Non-Invasive Evaluation(s):  Myocardial perfusion study 7/19: Equivocal due to marked adjacent gut activity overlying inferolateral segments.  No significant ischemia is appreciated involving any other segments  Stress echocardiogram 1/19: No echocardiographic evidence of inducible ischemia but patient only exercised 3 minutes and 6 seconds on Bruce protocol  6/18: Normal EF, aortic sclerosis, dilated PA, mild pulmonary hypertension    ______________________________________________________________________    Other past medical history, social history, family history, medications, allergies and problem list reviewed in the medical record.    Current cardiac medications include:  Furosemide 40 mg prn   Jardiance 10mg   ( PCP rx'd for DM)   Rosuvastatin 20mg  every day       Objective:     BP 118/70 (BP Site: L Arm, BP Position: Sitting, BP Cuff Size: Medium)  - Pulse 101  - Temp 36.4 ??C (97.5 ??F) (Temporal)  - Resp 17  - Wt 74.4 kg (164 lb)  - SpO2 98%  - BMI 30.99 kg/m??      Wt Readings from Last 3 Encounters:   08/16/21 74.4 kg (164 lb)   05/15/21 74.4 kg (164 lb)   02/01/21 72.6 kg (160 lb)           PHYSICAL EXAMINATION:   GENERAL:  Alert, NAD  ENT: Wearing a mask left eye with conjunctival erythema, watery discharge, some purulent drainage, no edema, normal pupil, no corneal opacification,   CARDIOVASCULAR:  Regular rate and rhythm with rare ectopy , normal S1/S2, no murmurs, rubs, or gallops.  No significant edema.  No carotid bruit.  RESPIRATORY:  Clear to auscultation bilaterally.  No wheezes, crackles, or rhonchi. Normal work of breathing.        ______________________________________________________________________    EKG today 08/16/21  shows sinus tachy at 103 bpm, PVC , normal axis, QT prolongation, and baseline artifact.   Since last tracing 05/03/20 - rate increased from 89bpm, PVC is now appreciated, and QTc has prolonged       Recent CV pertinent labs:  Lab Results   Component Value Date    LDL Calculated 100 (H) 01/23/2021    Non-HDL Cholesterol 129 01/23/2021    HDL 49 01/23/2021    Creatinine 1.19 08/16/2021    Creatinine 0.8 10/04/2018    Creatinine Whole Blood, POC 0.7 05/16/2016    Potassium 3.6 08/16/2021    Potassium 3.8 10/04/2018    BUN 22 08/16/2021    BUN 16.00 10/04/2018    TSH 1.444 08/16/2021

## 2021-08-16 NOTE — Unmapped (Addendum)
Called patient to inform her of her lab results as states below.  Encouraged patient to monitor her blood pressure and heart rate over the weekend and next week and to call our office with readings.  Faxed lab results to PCP via Epic and informed patient that I did so.  Patient verbalized understanding and all questions answered at this time.       ----- Message from Adrian Prows, PA sent at 08/16/2021  4:09 PM EDT -----  Pt doesn't have My Chart.   Will you call her and let her know her labs overall look ok - no clear reason for her elevated heart rate. A1c is 7.6%. thyroid is fine and she is not anemic. Ask her to please call next Wed or THursday with some BP and HR readings so we can see if the elevated HR is persistent.     Also let her know that globulins were a little elevated - this is nonspecific but should be followed by her PCP and possibly her Rheumatologist over time.     Will you please fax her labs to her PCP at [facility].     Thanks  Ss

## 2021-08-24 NOTE — Unmapped (Signed)
Carol Caldwell - will you call [name] - she was supposed to call back last week with BP and HR measurements since she was mildly tachy in the office.     Will you also check to see if her pink eye cleared up?    Lastly, I bumped her rosuvastatin in the fall and she was supposed to have lipids rechecked in January but I don't see those. If she had those done at her PCP, we need to obtain them from that office.     If not and If it's more convenient for her, we may be able to have those drawn at her PCP office rather than coming back to [facility]. I 'll need to fax an order and she may need to make a lab appt there.     Thanks!  SS

## 2021-08-26 NOTE — Unmapped (Signed)
Attempted to call patient, no answer, left voicemail to return call 

## 2021-08-27 DIAGNOSIS — Z79899 Other long term (current) drug therapy: Principal | ICD-10-CM

## 2021-08-27 DIAGNOSIS — E785 Hyperlipidemia, unspecified: Principal | ICD-10-CM

## 2021-08-27 DIAGNOSIS — Z5181 Encounter for therapeutic drug level monitoring: Principal | ICD-10-CM

## 2021-08-27 NOTE — Unmapped (Signed)
Contacted patient to follow up on her blood pressure and heart rate.  Patient states that her BP and heart rate has been better but she is not currently at home to provide readings.  Patient will drop off BP and heart rate readings tomorrow.      Patient states that her pink eye is improved but not completely healed.      Patient is not sure if she has had her lipids drawn so she will come to [facility] tomorrow, 08/28/21, for blood draw at 1020 and drop off her BP/HR readings at that time.  Patient verbalized understanding and all questions answered at this time.

## 2021-08-28 ENCOUNTER — Ambulatory Visit: Admit: 2021-08-28 | Discharge: 2021-08-29 | Payer: MEDICARE

## 2021-08-28 DIAGNOSIS — Z5181 Encounter for therapeutic drug level monitoring: Principal | ICD-10-CM

## 2021-08-28 DIAGNOSIS — Z79899 Other long term (current) drug therapy: Principal | ICD-10-CM

## 2021-08-28 DIAGNOSIS — E785 Hyperlipidemia, unspecified: Principal | ICD-10-CM

## 2021-08-28 LAB — LIPID PANEL
CHOLESTEROL/HDL RATIO SCREEN: 2.3 (ref 0.0–4.5)
CHOLESTEROL: 190 mg/dL (ref 0–200)
HDL CHOLESTEROL: 81 mg/dL — ABNORMAL HIGH (ref 40–60)
LDL CHOLESTEROL CALCULATED: 90 mg/dL (ref 30–130)
NON-HDL CHOLESTEROL: 109 mg/dL
TRIGLYCERIDES: 93 mg/dL (ref <=150)
VLDL CHOLESTEROL CAL: 18.6 mg/dL — ABNORMAL LOW (ref 25–40)

## 2021-08-28 LAB — COMPREHENSIVE METABOLIC PANEL
ALBUMIN/GLOBULIN RATIO: 0.8 — ABNORMAL LOW (ref 1.1–2.2)
ALBUMIN: 3.4 g/dL — ABNORMAL LOW (ref 3.5–5.0)
ALKALINE PHOSPHATASE: 62 U/L (ref 50–136)
ALT (SGPT): 26 U/L (ref 12–78)
ANION GAP: 6 mmol/L
AST (SGOT): 24 U/L (ref 7–31)
BILIRUBIN TOTAL: 0.2 mg/dL (ref 0.0–1.5)
BLOOD UREA NITROGEN: 19 mg/dL (ref 5–26)
BUN / CREAT RATIO: 19 (ref 12–25)
CALCIUM: 8.8 mg/dL (ref 8.5–10.5)
CHLORIDE: 104 mmol/L (ref 95–110)
CO2: 29.6 mmol/L (ref 21.0–31.0)
CREATININE: 0.98 mg/dL (ref 0.50–1.50)
EGFR CKD-EPI (2021) FEMALE: 64 mL/min/{1.73_m2} (ref >=60)
GLOBULIN, TOTAL: 4.4 g/dL — ABNORMAL HIGH (ref 1.5–4.3)
GLUCOSE RANDOM: 151 mg/dL — ABNORMAL HIGH (ref 60–100)
POTASSIUM: 4.5 mmol/L (ref 3.5–5.1)
PROTEIN TOTAL: 7.8 g/dL (ref 6.4–8.3)
SODIUM: 140 mmol/L (ref 136–145)

## 2021-08-29 DIAGNOSIS — Z5181 Encounter for therapeutic drug level monitoring: Principal | ICD-10-CM

## 2021-08-29 DIAGNOSIS — Z79899 Other long term (current) drug therapy: Principal | ICD-10-CM

## 2021-08-29 DIAGNOSIS — E785 Hyperlipidemia, unspecified: Principal | ICD-10-CM

## 2021-08-29 MED ORDER — ROSUVASTATIN 40 MG TABLET
ORAL_TABLET | Freq: Every day | ORAL | 3 refills | 90 days | Status: CP
Start: 2021-08-29 — End: 2022-08-29

## 2021-08-29 NOTE — Unmapped (Signed)
[facility] Refill Coordination Note    Specialty Medication(s) to be Shipped:   Inflammatory Disorders: Stelara    Other medication(s) to be shipped: No additional medications requested for fill at this time     Carol Caldwell, DOB: December 23, 1955  Phone: [phone] (home)       All above HIPAA information was verified with patient.     Was a Nurse, learning disability used for this call? No    Completed refill call assessment today to schedule patient's medication shipment from the [facility] [phone]).  All relevant notes have been reviewed.     Specialty medication(s) and dose(s) confirmed: Regimen is correct and unchanged.   Changes to medications: Carol Caldwell reports no changes at this time.  Changes to insurance: No  New side effects reported not previously addressed with a pharmacist or physician: None reported  Questions for the pharmacist: No    Confirmed patient received a Conservation officer, historic buildings and a Surveyor, mining with first shipment. The patient will receive a drug information handout for each medication shipped and additional FDA Medication Guides as required.       DISEASE/MEDICATION-SPECIFIC INFORMATION        For patients on injectable medications: Patient currently has 0 doses left.  Next injection is scheduled for 09/17/2021.    SPECIALTY MEDICATION ADHERENCE     Medication Adherence    Patient reported X missed doses in the last month: 0  Specialty Medication: Stelara  Patient is on additional specialty medications: No        Were doses missed due to medication being on hold? No    Stelara 45mg /0.61ml: Patient has 0 days of medication on hand    REFERRAL TO PHARMACIST     Referral to the pharmacist: Not needed    [facility]     Shipping address confirmed in Epic.     Delivery Scheduled: Yes, Expected medication delivery date: 08/30/2021.     Medication will be delivered via Same Day Courier to the prescription address in Epic WAM.    Carol Caldwell   [facility] Specialty Technician

## 2021-08-29 NOTE — Unmapped (Addendum)
Called patient to inform her of message below.  Patient states that she has been taking her rosuvastatin 20 mg everyday minus a few days when she was not feeling well.  Instructed patient to start taking rosuvastatin 40 mg daily if she is open to doing so.  Patient states that she will increase to 40 mg daily.  Instructed patient to take 2 of her 20 mg tablets until she runs out and then pick up new 40 mg prescription.  Patient verbalized understanding and lab appointment scheduled for 10/11/21 at 1020 here at [facility].  All questions answered at this time.        ----- Message from Adrian Prows, PA sent at 08/29/2021  4:29 PM EDT -----  Kidney and electrolytes look fine.   Cholesterol is improved but still not at goal for someone with diabetes. If she has been taking rosuvastatin 20mg  every day consistently since the fall, the I recommend increasing rosuvastatin to 40mg  daily. If she' open to this, then we would repeat lipids in 1-2 months ( does she want to do here or at her PCP office?) . She can take 2 of her 20mg  tablets until her current bottle runs out and I will send in rx for 40mg  tablets.

## 2021-08-30 MED FILL — STELARA 45 MG/0.5 ML SUBCUTANEOUS SYRINGE: 84 days supply | Qty: 0.5 | Fill #1

## 2021-09-02 ENCOUNTER — Encounter (INDEPENDENT_AMBULATORY_CARE_PROVIDER_SITE_OTHER): Payer: Medicare HMO | Admitting: Ophthalmology

## 2021-09-02 DIAGNOSIS — H35033 Hypertensive retinopathy, bilateral: Secondary | ICD-10-CM

## 2021-09-02 DIAGNOSIS — H43813 Vitreous degeneration, bilateral: Secondary | ICD-10-CM | POA: Diagnosis not present

## 2021-09-02 DIAGNOSIS — I10 Essential (primary) hypertension: Secondary | ICD-10-CM | POA: Diagnosis not present

## 2021-09-02 DIAGNOSIS — E113311 Type 2 diabetes mellitus with moderate nonproliferative diabetic retinopathy with macular edema, right eye: Secondary | ICD-10-CM | POA: Diagnosis not present

## 2021-09-02 DIAGNOSIS — E113392 Type 2 diabetes mellitus with moderate nonproliferative diabetic retinopathy without macular edema, left eye: Secondary | ICD-10-CM

## 2021-09-25 DIAGNOSIS — L4050 Arthropathic psoriasis, unspecified: Principal | ICD-10-CM

## 2021-09-25 MED ORDER — USTEKINUMAB 45 MG/0.5 ML SUBCUTANEOUS SYRINGE
SUBCUTANEOUS | 3 refills | 112 days | Status: CP
Start: 2021-09-25 — End: ?
  Filled 2021-10-30: qty 0.5, 56d supply, fill #0

## 2021-09-25 NOTE — Unmapped (Signed)
Clinical Assessment Needed For: Dose Change  Medication: STELARA 45 mg/0.5 mL Syrg syringe (ustekinumab)  Last Fill Date/Day Supply: 08/30/2021 / 84  Refill Too Soon until 10/16/2021  Was previous dose already scheduled to fill: No    Notes to Pharmacist: Please let me know if medication needs to be filled sooner than 7/5.

## 2021-09-25 NOTE — Unmapped (Signed)
[facility] Clinical Assessment    Patient states Carol Caldwell helps a little bit but starts wearing off 1-2 weeks before next injection is due.  Patient still has a lot of back and hip pain     Carol Caldwell, DOB: July 25, 1955  Phone: [phone] (home)     All above HIPAA information was verified with patient.        Was a Nurse, learning disability used for this call? No    Specialty Medication(s):   Inflammatory Disorders: Stelara     Current Outpatient Medications   Medication Sig Dispense Refill   ??? bromfenac 0.075 % Drop Apply to eye.     ??? diclofenac sodium (VOLTAREN) 1 % gel Apply 2 g topically four (4) times a day. 100 g 3   ??? DULoxetine (CYMBALTA) 30 MG capsule Take 1 capsule (30 mg total) by mouth daily.     ??? empagliflozin (JARDIANCE) 10 mg tablet Take 1 tablet (10 mg total) by mouth daily.     ??? empty container Misc Use as directed 1 each 2   ??? ergocalciferol-1,250 mcg, 50,000 unit, (DRISDOL) 1,250 mcg (50,000 unit) capsule Take 1 capsule (1,250 mcg total) by mouth once a week. 4 capsule 2   ??? furosemide (LASIX) 40 MG tablet Take 1 tablet (40 mg total) by mouth every other day. 45 tablet 3   ??? gabapentin (NEURONTIN) 300 MG capsule Take 2 capsules (600 mg total) by mouth Three (3) times a day.     ??? polymyxin B sulf-trimethoprim (POLYTRIM) 10,000 unit- 1 mg/mL Drop Administer 1 drop into the left eye six (6) times a day. 10 mL 0   ??? rosuvastatin (CRESTOR) 40 MG tablet Take 1 tablet (40 mg total) by mouth daily. 90 tablet 3   ??? ustekinumab (STELARA) 45 mg/0.5 mL Syrg syringe Inject the contents of 1 syringe (45 mg) under the skin once every 12 weeks as maintenance dose after initial 2 loading doses. 0.5 mL 3   ??? vit C,E-Zn-coppr-lutein-zeaxan (PRESERVISION AREDS-2) 250-90-40-1 mg cap Take by mouth Two (2) times a day.       No current facility-administered medications for this visit.        Changes to medications: Carol Caldwell reports no changes at this time.    No Known Allergies    Changes to allergies: No    SPECIALTY MEDICATION ADHERENCE       Medication Adherence    Patient reported X missed doses in the last month: 0  Specialty Medication: Stelara q12w  Patient is on additional specialty medications: No  Informant: patient          Specialty medication(s) dose(s) confirmed: Regimen is correct and unchanged.     Are there any concerns with adherence? No    Adherence counseling provided? Not needed    CLINICAL MANAGEMENT AND INTERVENTION      Clinical Benefit Assessment:    Do you feel the medicine is effective or helping your condition? Yes    Clinical Benefit counseling provided? consulted provider regarding clinical benefit concerns    Adverse Effects Assessment:    Are you experiencing any side effects? No    Are you experiencing difficulty administering your medicine? No    Quality of Life Assessment:    Quality of Life    Rheumatology  Oncology  Dermatology  Cystic Fibrosis          How many days over the past month did your psoriatic arthritis  keep you from  your normal activities? For example, brushing your teeth or getting up in the morning. 0    Have you discussed this with your provider? Not needed    Acute Infection Status:    Acute infections noted within Epic:  No active infections  Patient reported infection: None    Therapy Appropriateness:    Is therapy appropriate and patient progressing towards therapeutic goals? Yes, therapy is appropriate and should be continued    DISEASE/MEDICATION-SPECIFIC INFORMATION      For patients on injectable medications: Patient currently has 0 doses left.  Next injection is scheduled for 8/29.    PATIENT SPECIFIC NEEDS     - Does the patient have any physical, cognitive, or cultural barriers? No    - Is the patient high risk? No    - Does the patient require a Care Management Plan? No     SOCIAL DETERMINANTS OF HEALTH     At the [facility], we have learned that life circumstances - like trouble affording food, housing, utilities, or transportation can affect the health of many of our patients.   That is why we wanted to ask: are you currently experiencing any life circumstances that are negatively impacting your health and/or quality of life? Patient declined to answer    Social Determinants of Health     Financial Resource Strain: Not on file   Internet Connectivity: Not on file   Food Insecurity: Not on file   Tobacco Use: Low Risk    ??? Smoking Tobacco Use: Never   ??? Smokeless Tobacco Use: Never   ??? Passive Exposure: Not on file   Housing/Utilities: Not on file   Alcohol Use: Not on file   Transportation Needs: Not on file   Substance Use: Not on file   Health Literacy: Not on file   Physical Activity: Not on file   Interpersonal Safety: Not on file   Stress: Not on file   Intimate Partner Violence: Not on file   Depression: Not on file   Social Connections: Not on file       Would you be willing to receive help with any of the needs that you have identified today? Not applicable       SHIPPING       The patient Caldwell receive a drug information handout for each medication shipped and additional FDA Medication Guides as required.  Verified that patient has previously received a Conservation officer, historic buildings and a Surveyor, mining.    The patient or caregiver noted above participated in the development of this care plan and knows that they can request review of or adjustments to the care plan at any time.      All of the patient's questions and concerns have been addressed.    Julianne Rice   [facility] Shared [facility] Specialty Pharmacist

## 2021-09-25 NOTE — Unmapped (Signed)
Addended by: Lurlean Nanny on: 09/25/2021 02:19 PM     Modules accepted: Orders

## 2021-10-07 ENCOUNTER — Encounter (INDEPENDENT_AMBULATORY_CARE_PROVIDER_SITE_OTHER): Payer: Medicare HMO | Admitting: Ophthalmology

## 2021-10-07 DIAGNOSIS — H43813 Vitreous degeneration, bilateral: Secondary | ICD-10-CM | POA: Diagnosis not present

## 2021-10-07 DIAGNOSIS — E113313 Type 2 diabetes mellitus with moderate nonproliferative diabetic retinopathy with macular edema, bilateral: Secondary | ICD-10-CM

## 2021-10-14 ENCOUNTER — Ambulatory Visit: Admit: 2021-10-14 | Discharge: 2021-10-15 | Payer: MEDICARE

## 2021-10-14 DIAGNOSIS — E785 Hyperlipidemia, unspecified: Principal | ICD-10-CM

## 2021-10-14 DIAGNOSIS — Z5181 Encounter for therapeutic drug level monitoring: Principal | ICD-10-CM

## 2021-10-14 DIAGNOSIS — Z79899 Other long term (current) drug therapy: Principal | ICD-10-CM

## 2021-10-14 LAB — LIPID PANEL
CHOLESTEROL/HDL RATIO SCREEN: 2 (ref 0.0–4.5)
CHOLESTEROL: 174 mg/dL (ref 0–200)
HDL CHOLESTEROL: 86 mg/dL — ABNORMAL HIGH (ref 40–60)
LDL CHOLESTEROL CALCULATED: 67 mg/dL (ref 30–130)
NON-HDL CHOLESTEROL: 88 mg/dL
TRIGLYCERIDES: 106 mg/dL (ref <=150)
VLDL CHOLESTEROL CAL: 21.2 mg/dL — ABNORMAL LOW (ref 25–40)

## 2021-10-14 LAB — AST: AST (SGOT): 29 U/L (ref 7–31)

## 2021-10-14 LAB — ALT: ALT (SGPT): 32 U/L (ref 12–78)

## 2021-10-16 NOTE — Unmapped (Signed)
Called and informed Ms.Britain that Carol Caldwell reviewed her labs and her cholesterol is at goal on the hight dose of the rosuvastatin and her liver lab work looks good. She voiced understanding and appreciation.

## 2021-10-16 NOTE — Unmapped (Signed)
-----   Message from Adrian Prows, [place] sent at 10/15/2021 10:29 AM EDT -----  Would you please let pt know that her cholesterol is at goal on the higher dose of rosuvastatin. Liver tests were fine.     #she doesn't use My Chart#     Thank you!   Maralyn Sago

## 2021-10-17 NOTE — Unmapped (Signed)
[facility] Clinical Assessment & Refill Coordination Note    Carol Caldwell, DOB: 10-31-1955  Phone: [phone] (home)     All above HIPAA information was verified with patient.     Was a Nurse, learning disability used for this call? No    Specialty Medication(s):   Inflammatory Disorders: Stelara     Current Outpatient Medications   Medication Sig Dispense Refill   ??? bromfenac 0.075 % Drop Apply to eye.     ??? diclofenac sodium (VOLTAREN) 1 % gel Apply 2 g topically four (4) times a day. 100 g 3   ??? DULoxetine (CYMBALTA) 30 MG capsule Take 1 capsule (30 mg total) by mouth daily.     ??? empagliflozin (JARDIANCE) 10 mg tablet Take 1 tablet (10 mg total) by mouth daily.     ??? empty container Misc Use as directed 1 each 2   ??? ergocalciferol-1,250 mcg, 50,000 unit, (DRISDOL) 1,250 mcg (50,000 unit) capsule Take 1 capsule (1,250 mcg total) by mouth once a week. 4 capsule 2   ??? furosemide (LASIX) 40 MG tablet Take 1 tablet (40 mg total) by mouth every other day. 45 tablet 3   ??? gabapentin (NEURONTIN) 300 MG capsule Take 2 capsules (600 mg total) by mouth Three (3) times a day.     ??? polymyxin B sulf-trimethoprim (POLYTRIM) 10,000 unit- 1 mg/mL Drop Administer 1 drop into the left eye six (6) times a day. 10 mL 0   ??? rosuvastatin (CRESTOR) 40 MG tablet Take 1 tablet (40 mg total) by mouth daily. 90 tablet 3   ??? ustekinumab (STELARA) 45 mg/0.5 mL Syrg syringe Inject the contents of 1 syringe (45 mg total) under the skin every 8 weeks. 1 mL 3   ??? vit C,E-Zn-coppr-lutein-zeaxan (PRESERVISION AREDS-2) 250-90-40-1 mg cap Take by mouth Two (2) times a day.       No current facility-administered medications for this visit.        Changes to medications: Zephyr reports no changes at this time.    No Known Allergies    Changes to allergies: No    SPECIALTY MEDICATION ADHERENCE            Specialty medication(s) dose(s) confirmed: Patient reports changes to the regimen as follows: moved to Q8w     Are there any concerns with adherence? No    Adherence counseling provided? Not needed    CLINICAL MANAGEMENT AND INTERVENTION      Clinical Benefit Assessment:    Do you feel the medicine is effective or helping your condition? Yes    Clinical Benefit counseling provided? Not needed    Adverse Effects Assessment:    Are you experiencing any side effects? No    Are you experiencing difficulty administering your medicine? No    Quality of Life Assessment:    Quality of Life    Rheumatology  Oncology  Dermatology  Cystic Fibrosis            Have you discussed this with your provider? Not needed    Acute Infection Status:    Acute infections noted within Epic:  No active infections  Patient reported infection: None    Therapy Appropriateness:    Is therapy appropriate and patient progressing towards therapeutic goals? Yes, therapy is appropriate and should be continued    DISEASE/MEDICATION-SPECIFIC INFORMATION      For patients on injectable medications: Patient currently has 0 doses left.  Next injection is scheduled for 8/1.  PATIENT SPECIFIC NEEDS     - Does the patient have any physical, cognitive, or cultural barriers? No    - Is the patient high risk? No    - Does the patient require a Care Management Plan? No     SOCIAL DETERMINANTS OF HEALTH     At the [facility], we have learned that life circumstances - like trouble affording food, housing, utilities, or transportation can affect the health of many of our patients.   That is why we wanted to ask: are you currently experiencing any life circumstances that are negatively impacting your health and/or quality of life? Patient declined to answer    Social Determinants of Health     Financial Resource Strain: Not on file   Internet Connectivity: Not on file   Food Insecurity: Not on file   Tobacco Use: Low Risk    ??? Smoking Tobacco Use: Never   ??? Smokeless Tobacco Use: Never   ??? Passive Exposure: Not on file   Housing/Utilities: Not on file   Alcohol Use: Not on file Transportation Needs: Not on file   Substance Use: Not on file   Health Literacy: Not on file   Physical Activity: Not on file   Interpersonal Safety: Not on file   Stress: Not on file   Intimate Partner Violence: Not on file   Depression: Not on file   Social Connections: Not on file       Would you be willing to receive help with any of the needs that you have identified today? Not applicable       SHIPPING     Specialty Medication(s) to be Shipped:   Inflammatory Disorders: Stelara    Other medication(s) to be shipped: No additional medications requested for fill at this time     Changes to insurance: No    Delivery Scheduled: Yes, Expected medication delivery date: 7/19.     Medication will be delivered via Same Day Courier to the confirmed prescription address in [facility].    The patient will receive a drug information handout for each medication shipped and additional FDA Medication Guides as required.  Verified that patient has previously received a Conservation officer, historic buildings and a Surveyor, mining.    The patient or caregiver noted above participated in the development of this care plan and knows that they can request review of or adjustments to the care plan at any time.      All of the patient's questions and concerns have been addressed.    Julianne Rice   [facility] Specialty Pharmacist

## 2021-10-24 NOTE — Unmapped (Signed)
[facility] of Medications Program (CAMP) Clinic-- Medication Adherence Clinic SUCCESSFUL ENGAGEMENT      Carol Caldwell is a 66 y.o. female identified as having an adherence of <80%  for a hypertension medication, based on payer reports and chart review. Contacted the Patient to review adherence to the medication(s) described below.    Clinical Medication Adherence Assessment      Medication Assessed #1: ENALAPRIL 5 MG   Chronic Disease State: Hypertension   Source of Data/Request for Medication Adherence Assessment: Payer Report   Does patient indicate they are taking their medication as prescribed?: Yes   Are prescription instructions in line with the way patient is currently taking this medication?: Yes   Medication Refills Remaining?: Yes   Date of Last Refill: 10/18/21   Do you currently receive a 90-day supply of this medication?: Yes   In general do you have any trouble obtaining or affording your medications?: No   Does the patient have any perceived side effects from this medication?: No         What other reasons can you think of that may have caused missed doses or gaps in medication fill history?: None   Actions taken for this medication: Adherence Aid Recommended, Education Provided      General Medication Adherence: :   Population: Product manager Source: Patient   Who helps you with your medications?: Manage Myself   What reminder methods or adherence aids do you currently use to take your medications?: None   Thinking about all of your medications, do you have any issues or concerns that we can further assist you with?: No      Would you be interested in discussing your medication concerns further through a scheduled virtual visit with a pharmacist?: No   Interventions Taken: None          Patient reports taking Enalapril as prescribed. Enalapril was last refilled 10/18/21 for 90 day supply. Patient reports no miss doses, she doesn't use any tools to help manage her medications. Patient has no concerns with copayment.     Marquis Buggy , CPhT  Certified Pharmacy Technician  [facility] Assessment of Medications Program (CAMP)

## 2021-11-04 ENCOUNTER — Encounter (INDEPENDENT_AMBULATORY_CARE_PROVIDER_SITE_OTHER): Payer: Medicare HMO | Admitting: Ophthalmology

## 2021-11-04 DIAGNOSIS — E113313 Type 2 diabetes mellitus with moderate nonproliferative diabetic retinopathy with macular edema, bilateral: Secondary | ICD-10-CM | POA: Diagnosis not present

## 2021-11-04 DIAGNOSIS — H43813 Vitreous degeneration, bilateral: Secondary | ICD-10-CM | POA: Diagnosis not present

## 2021-12-09 ENCOUNTER — Encounter (INDEPENDENT_AMBULATORY_CARE_PROVIDER_SITE_OTHER): Payer: Medicare HMO | Admitting: Ophthalmology

## 2021-12-11 ENCOUNTER — Encounter (INDEPENDENT_AMBULATORY_CARE_PROVIDER_SITE_OTHER): Payer: Medicare HMO | Admitting: Ophthalmology

## 2021-12-11 DIAGNOSIS — H43813 Vitreous degeneration, bilateral: Secondary | ICD-10-CM | POA: Diagnosis not present

## 2021-12-11 DIAGNOSIS — E113313 Type 2 diabetes mellitus with moderate nonproliferative diabetic retinopathy with macular edema, bilateral: Secondary | ICD-10-CM | POA: Diagnosis not present

## 2021-12-19 ENCOUNTER — Ambulatory Visit: Admit: 2021-12-19 | Discharge: 2021-12-20 | Payer: MEDICARE

## 2021-12-19 DIAGNOSIS — L4050 Arthropathic psoriasis, unspecified: Principal | ICD-10-CM

## 2021-12-19 DIAGNOSIS — Z7185 Immunization counseling: Principal | ICD-10-CM

## 2021-12-19 DIAGNOSIS — L409 Psoriasis, unspecified: Principal | ICD-10-CM

## 2021-12-19 NOTE — Unmapped (Signed)
Last clinic visit: Feb 2023 with Carlus Pavlov PA-c      Accompanied by: alone     Chief complaint: follow-up Psoriatic Arthritis (PsA)      History of Present Illness:     HPI:  Carol Caldwell is a 66 y.o. 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 She started humira in 09/2016, currently taking this weekly. We have avoided mtx/leflunomide due to alcohol use. 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     Treatment history:  - We have avoided mtx/leflunomide due to alcohol use.   - Humira started 09/2016  - Switched to cosentyx 06/2020  - Increased to full dose of cosentyx (300 mg) in 09/2020  - Cosentyx changed to Stelara in 02/2021 due to persistent back pain    Current Treatment:  - Stelara 45 mg sq q8 weeks    Interval History:  - Arthritis is doing OK, some good times, some bad times, but more good than bad.  - Still has some pain in her thumbs in 1st MCPs, can hurt anytime, mostly with using hands.   - Some occasional back pain, improved compared to previously.   - Denies AM stiffness.  - Some psoriasis on her feet which she reports is hardening, not seen derm in a few years.   - Peeling skin on fingertips.   - Pt now on Stelara every 8 weeks, took Aug 1st, prior to that she took in June. Increased from q12 weeks to q8 weeks after letting pharmacist know still with back and hip pain in June 2023, pt reports working better now. Also on gabapentin which she reports also helps her arthritis.   - No injection site reactions.     Objective    Physical Exam:  Vitals:    12/19/21 1208   BP: 146/94   BP Site: L Arm   BP Position: Sitting   BP Cuff Size: Medium   Pulse: 95   Temp: 36.5 ??C (97.7 ??F)   TempSrc: Temporal   Weight: 73 kg (161 lb)   Height: 170.2 cm (5' 7)     Body mass index is 25.22 kg/m??.  GENERAL: The patient is well appearing, in no acute distress. Ambulates around exam room easily and climbs on exam table without difficulty.  SKIN: No rash.   EYES: PERRL. Sclera anicteric, conjunctiva non-injected.   ENT: mucus membranes moist.   Neck: supple, no cervical lymphadenopathy  Respiratory: Breathing non-labored, CTA bilaterally  CV: Heart rate regular, no murmurs  VASCULAR: warm and well perfused extremities, no c/c/e.  NEURO: CN 2-12 grossly intact.   PSYCH: No depression or anxiety. Cooperative. Alert and oriented.   MUSCULOSKELETAL:   ?? Bilateral shoulders, elbows, wrists, hands, fingers:  No deformity, erythema, warmth, swelling, effusion, tenderness, or limited ROM.?? Able to curl all fingers. Prayer sign negative. Except: Bilat 1st MCP and 1st CMC TTP, + grind sign bilat.   ?? Spine nontender to palpation.  Except: Mild TTP SI region  ?? Bilateral knees, ankles, feet, toes: No deformity, erythema, warmth, swelling, effusion, tenderness, or limited ROM. MTP squeeze negative. Except: Bilat ankles TTP.       Assessment/Plan:     Carol Caldwell is a 66 y.o. female with a history of Psoriatic Arthritis (PsA) characterized by skin psoriasis and L sacroiliitis. She started humira in 09/2016, currently taking this weekly.  We have avoided mtx/leflunomide due to alcohol use.      Psoriasis and Psoriatic Arthritis: Currently feeling well on Stelara, improved symptoms after increased to q8 weeks. Today pt tells me she is satisfied with current treatment for Psoriatic Arthritis (PsA). Mild psoriasis, not seen derm in several years.   - Continue Stelara q8 weeks.   - Discussed option of referral to derm to discuss topicals or other treatment for psoriasis which she deferred today.      High risk medication monitoring:   - Patient is currently taking Stelara; no regular monitoring needed  - Patient had a quant gold in 09/03/2016 that was neg.  she does not have any risk factors for TB exposure - no travel to TB-endemic area, does not volunteer, work, or live at [employer] or correctional facility, does not care for TB patients, no contact with individual with active pulmonary TB. Does not require repeat quant gold unless she has new risk factors for TB exposure. TB risk factors were reviewed: 12/19/2021      Immunization Counseling:  Flu vaccine: 12/19/2021 today in clinic   Prevnar PCV-20: 10/03/2020      Covid Vaccination:   Bivalent booster: 02/01/2021  Recommend updated booster when available     We discussed the above including diagnosis and recommendations, agreed on the above plan, and all questions were answered.    Follow-up: Return for Follow-up PsA, 6 months with Carlus Pavlov, 12 months with me.    Danella Maiers, MD, MSCI  Assistant Professor of Medicine  Department of Medicine/Division of Rheumatology  [place] [facility] at [facility]  [phone] clinic phone  [phone] clinic secure fax      We appreciate the opportunity to participate in the care of this patient. I personally spent 20 minutes face-to-face and non-face-to-face in the care of this patient, which includes all pre, intra, and post visit time on the date of service.       Diagnoses and all orders for this visit:    Psoriatic arthritis (CMS-HCC)    Psoriasis    Immunization counseling    Other orders  -     INFLUENZA VACCINE (QUAD) IM - 6 MO-ADULT - PF

## 2021-12-26 NOTE — Unmapped (Signed)
[facility] Refill Coordination Note    Specialty Medication(s) to be Shipped:   Inflammatory Disorders: Stelara    Other medication(s) to be shipped: No additional medications requested for fill at this time     Carol Caldwell, DOB: 03-10-1956  Phone: [phone] (home)       All above HIPAA information was verified with patient.     Was a Nurse, learning disability used for this call? No    Completed refill call assessment today to schedule patient's medication shipment from the [facility] [phone]).  All relevant notes have been reviewed.     Specialty medication(s) and dose(s) confirmed: Regimen is correct and unchanged.   Changes to medications: Huntley reports no changes at this time.  Changes to insurance: No  New side effects reported not previously addressed with a pharmacist or physician: None reported  Questions for the pharmacist: No    Confirmed patient received a Conservation officer, historic buildings and a Surveyor, mining with first shipment. The patient will receive a drug information handout for each medication shipped and additional FDA Medication Guides as required.       DISEASE/MEDICATION-SPECIFIC INFORMATION        For patients on injectable medications: Patient currently has 0 doses left.  Next injection is scheduled for 01/07/22.    SPECIALTY MEDICATION ADHERENCE     Medication Adherence    Patient reported X missed doses in the last month: 0  Specialty Medication: STELARA 45 mg/0.5 mL  Patient is on additional specialty medications: No                                Were doses missed due to medication being on hold? No    Stelara 45/0.5 mg/ml: 0 days of medicine on hand        REFERRAL TO PHARMACIST     Referral to the pharmacist: Not needed      [facility]     Shipping address confirmed in Epic.     Delivery Scheduled: Yes, Expected medication delivery date: 01/02/22.     Medication will be delivered via Same Day Courier to the prescription address in Epic WAM.    Unk Lightning   [facility] Specialty Technician

## 2022-01-02 MED FILL — STELARA 45 MG/0.5 ML SUBCUTANEOUS SYRINGE: SUBCUTANEOUS | 56 days supply | Qty: 0.5 | Fill #1

## 2022-01-13 ENCOUNTER — Encounter (INDEPENDENT_AMBULATORY_CARE_PROVIDER_SITE_OTHER): Payer: Medicare HMO | Admitting: Ophthalmology

## 2022-01-20 ENCOUNTER — Encounter (INDEPENDENT_AMBULATORY_CARE_PROVIDER_SITE_OTHER): Payer: Medicare HMO | Admitting: Ophthalmology

## 2022-01-21 NOTE — Unmapped (Signed)
[facility] Assessment of Medications Program (CAMP) Clinic-- Medication Adherence  NO OUTREACH    Carol Caldwell is a 66 y.o. female identified as having an adherence of <80%  for a diabetes medication  and hypertension medication, based on payer reports and chart review.    Population:  HUMANA-MA    After chart and pharmacy claims review regarding the medication(s) below, patient outreach was not needed due to the following reason(s):         enalapril 5  mg: Recently filled at [facility]  on 01/14/2022 for 90 days supply    empagliflozin (Jardiance) 25mg : Recently filled at [facility] on 01/14/2022 for 90 days supply              Alberteen Sam , CPhT  Certified Pharmacy Technician  [place] Assessment of Medications Program (CAMP)

## 2022-01-27 ENCOUNTER — Encounter (INDEPENDENT_AMBULATORY_CARE_PROVIDER_SITE_OTHER): Payer: Medicare HMO | Admitting: Ophthalmology

## 2022-02-03 ENCOUNTER — Encounter (INDEPENDENT_AMBULATORY_CARE_PROVIDER_SITE_OTHER): Payer: Medicare HMO | Admitting: Ophthalmology

## 2022-02-10 ENCOUNTER — Encounter (INDEPENDENT_AMBULATORY_CARE_PROVIDER_SITE_OTHER): Payer: Medicare HMO | Admitting: Ophthalmology

## 2022-02-10 DIAGNOSIS — H43813 Vitreous degeneration, bilateral: Secondary | ICD-10-CM | POA: Diagnosis not present

## 2022-02-10 DIAGNOSIS — E113313 Type 2 diabetes mellitus with moderate nonproliferative diabetic retinopathy with macular edema, bilateral: Secondary | ICD-10-CM

## 2022-02-14 NOTE — Unmapped (Signed)
[facility] Refill Coordination Note    Specialty Medication(s) to be Shipped:   Inflammatory Disorders: Stelara    Other medication(s) to be shipped: No additional medications requested for fill at this time     Carol Caldwell, DOB: [birth year]  Phone: [phone] (home)       All above HIPAA information was verified with patient.     Was a Nurse, learning disability used for this call? No    Completed refill call assessment today to schedule patient's medication shipment from the [facility] [phone]).  All relevant notes have been reviewed.     Specialty medication(s) and dose(s) confirmed: Regimen is correct and unchanged.   Changes to medications: Carol Caldwell reports no changes at this time.  Changes to insurance: No  New side effects reported not previously addressed with a pharmacist or physician: None reported  Questions for the pharmacist: No    Confirmed patient received a Conservation officer, historic buildings and a Surveyor, mining with first shipment. The patient will receive a drug information handout for each medication shipped and additional FDA Medication Guides as required.       DISEASE/MEDICATION-SPECIFIC INFORMATION        For patients on injectable medications: Patient currently has 0 doses left.  Next injection is scheduled for 11/21.    SPECIALTY MEDICATION ADHERENCE     Medication Adherence    Patient reported X missed doses in the last month: 0  Specialty Medication: Stelara  Patient is on additional specialty medications: No  Patient is on more than two specialty medications: No  Any gaps in refill history greater than 2 weeks in the last 3 months: no  Demonstrates understanding of importance of adherence: yes  Informant: patient                          Were doses missed due to medication being on hold? No    Stelara 45mg /0.42ml: Patient has 0 days of medication on hand     REFERRAL TO PHARMACIST     Referral to the pharmacist: Not needed      [facility]     Shipping address confirmed in Epic.     Delivery Scheduled: Yes, Expected medication delivery date: 11/14.     Medication will be delivered via Same Day Courier to the prescription address in Epic WAM.    Carol Caldwell   [facility] Specialty Technician

## 2022-02-17 ENCOUNTER — Ambulatory Visit: Admit: 2022-02-17 | Discharge: 2022-02-18 | Payer: MEDICARE | Attending: Internal Medicine | Primary: Internal Medicine

## 2022-02-17 DIAGNOSIS — R55 Syncope and collapse: Principal | ICD-10-CM

## 2022-02-17 DIAGNOSIS — E785 Hyperlipidemia, unspecified: Principal | ICD-10-CM

## 2022-02-17 NOTE — Unmapped (Signed)
We will place a heart rhythm monitor (aka ziopatch) to evaluate for any heart rhythm abnormalities    We will also obtain an echocardiogram (ultrasound of your heart) to evaluate the structure and function of your heart    We discussed monitoring your BP at home. Your goal is to have most readings <130/80. If it is persistently above goal, let me know

## 2022-02-17 NOTE — Unmapped (Signed)
Assessment and Plan:   Carol Caldwell is a 66 y.o. female with a history of diabetes, psoriatic arthritis and hyperlipidemia who presents in clinic today for follow-up.    1.  Syncope  Patient with recent episode of syncope.  Etiology unclear.  Electrocardiogram today shows sinus tachycardia, which has been observed previously.  Will obtain echocardiogram to reevaluate the structure and function of her heart as well as a Zio patch monitor to evaluate for potentially causative arrhythmias.    2.  Hyperlipidemia  Reviewed labs 10/2021, LFTs unremarkable and non-HDL well controlled at 88.  Continue rosuvastatin 40 mg daily.      Carol Francois, MD  [facility]--Cardiology  Office [phone]  Office [phone]      Subjective:   PCP: [facility] [place]  Patient: Carol Caldwell  DOB: September 25, 1955    Reason for visit:  Edema  HPI: Carol Caldwell is a 66 y.o. female with a history of diabetes who presents in clinic today for follow-up.  Since her last visit, patient had an episode of syncope.  This occurred 3 weeks ago.  It occurred overnight at 3 AM.  She had woken up to care for her son.  As she was walking out, she does not know exactly what happened but hit the floor.  She is unclear for how long she was out of it.  She awoke, finished her task, and went back to bed.  She has not had recurrent episodes since.  She denies any chest pain, shortness of breath, or palpitations.  She reports swelling has been well controlled.  She has been working on keeping her legs elevated.    ______________________________________________________________________    Pertinent Medical History, Cardiovascular History & Procedures:    Pertinent PMH:  Diabetes  Psoriatic arthritis  Vitamin D deficiency    Cath / PCI:  None    CV Surgery:   None    EP Procedures and Devices:  None    Non-Invasive Evaluation(s):  Myocardial perfusion study 7/19: Equivocal due to marked adjacent gut activity overlying inferolateral segments.  No significant ischemia is appreciated involving any other segments  Stress echocardiogram 1/19: No echocardiographic evidence of inducible ischemia but patient only exercised 3 minutes and 6 seconds on Bruce protocol  6/18: Normal EF, aortic sclerosis, dilated PA, mild pulmonary hypertension    ______________________________________________________________________    Other past medical history, social history, family history, medications, allergies and problem list reviewed in the medical record.    Current cardiac medications include:  Furosemide 40 mg as needed  Jardiance 10 mg daily  Rosuvastatin 40 mg daily      Objective:     BP 144/82 (BP Site: L Arm, BP Position: Sitting, BP Cuff Size: Medium)  - Pulse 95  - Wt 70.8 kg (156 lb)  - SpO2 99%  - BMI 24.43 kg/m??     PHYSICAL EXAMINATION:   GENERAL:  Alert, NAD  CARDIOVASCULAR:  Regular rate and rhythm, normal S1/S2, no murmurs, rubs, or gallops. Tr b/l LE edema.  RESPIRATORY:  Clear to auscultation bilaterally.  No wheezes, crackles, or rhonchi. Normal work of breathing.    ______________________________________________________________________    EKG today shows sinus tachycardia at 102 bpm, normal axis, and no diagnostic ischemic changes.       Recent CV pertinent labs:  Lab Results   Component Value Date    LDL Calculated 67 10/14/2021    Non-HDL Cholesterol 88 10/14/2021  HDL 86 (H) 10/14/2021    Creatinine 0.98 08/28/2021    Creatinine 0.8 10/04/2018    Creatinine Whole Blood, POC 0.7 05/16/2016    Potassium 4.5 08/28/2021    Potassium 3.8 10/04/2018    BUN 19 08/28/2021    BUN 16.00 10/04/2018    TSH 1.444 08/16/2021

## 2022-02-24 ENCOUNTER — Ambulatory Visit: Admit: 2022-02-24 | Discharge: 2022-02-25 | Payer: MEDICARE

## 2022-02-25 MED FILL — STELARA 45 MG/0.5 ML SUBCUTANEOUS SYRINGE: SUBCUTANEOUS | 56 days supply | Qty: 0.5 | Fill #2

## 2022-02-25 NOTE — Unmapped (Signed)
Called and informed Ms. Carol Caldwell that Dr. Zenaida Deed has reviewed her echocardiogram and there are no worrisome abnormalities on it. She voiced understanding and appreciation.

## 2022-03-02 IMAGING — CT CT ANGIO CHEST
2 of 6 series · 18 of 46 positions shown · IV contrast (APPLIED)
Comparison: Chest radiograph 09/20/2018

CLINICAL DATA: Chest pain and cough

EXAM:
CT ANGIOGRAPHY CHEST WITH CONTRAST
TECHNIQUE: Multidetector CT imaging of the chest was performed using the
standard protocol during bolus administration of intravenous
contrast. Multiplanar CT image reconstructions and MIPs were
obtained to evaluate the vascular anatomy.
CONTRAST:  75mL OMNIPAQUE IOHEXOL 350 MG/ML SOLN

[Series 5: thins · axial · 0.60mm/px · z∈[-693,-445]mm · 15 of 272 slices shown]
[im 12/272  lung]
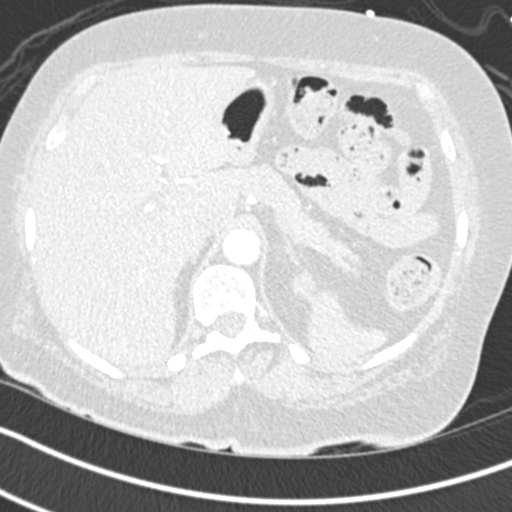
[im 36/272  soft-tissue]
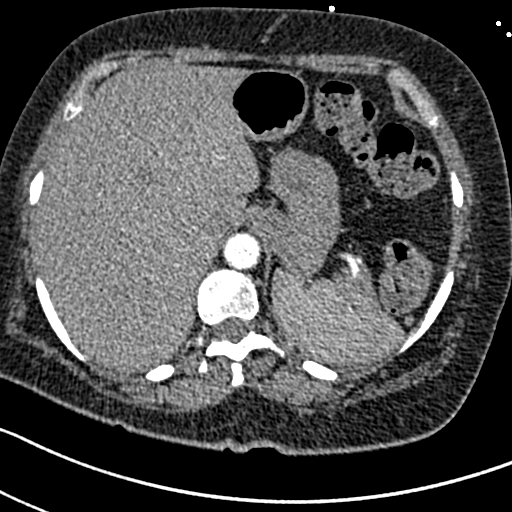
[im 48/272  lung]
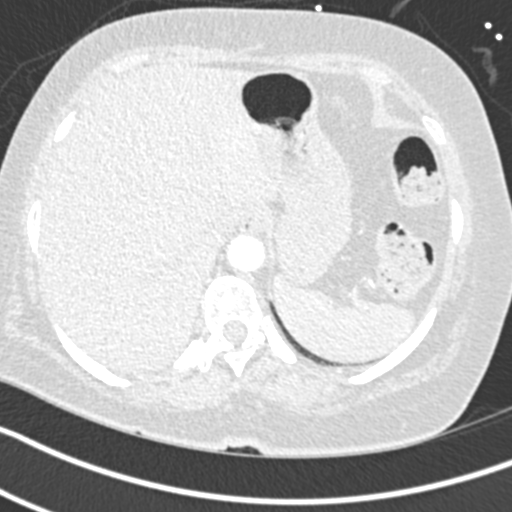
[im 71/272  soft-tissue]
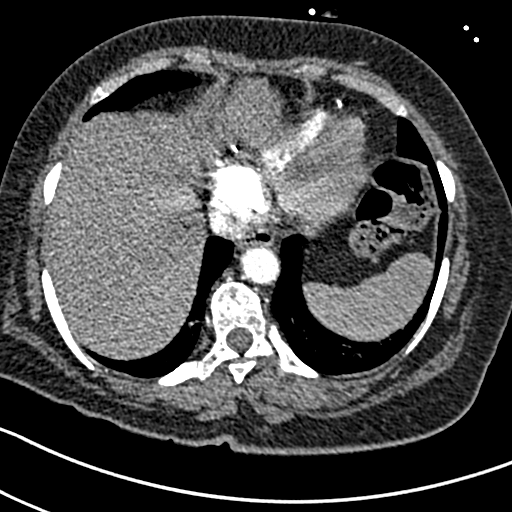
[im 83/272  lung]
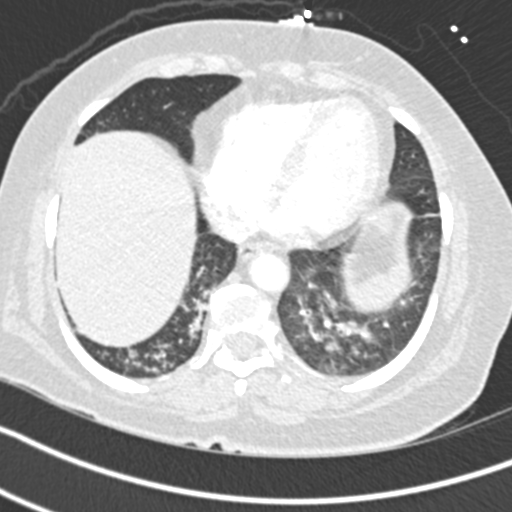
[im 107/272  soft-tissue]
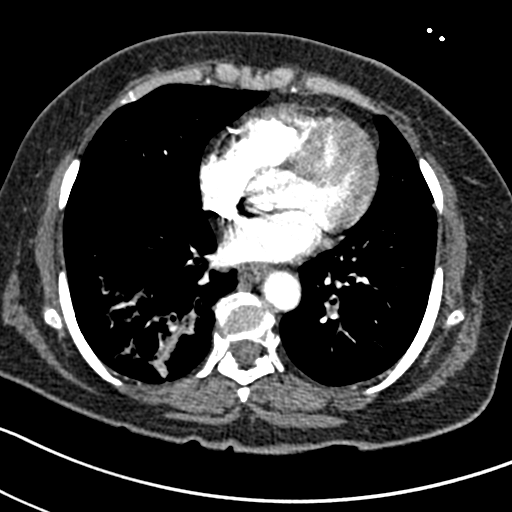
[im 118/272  lung]
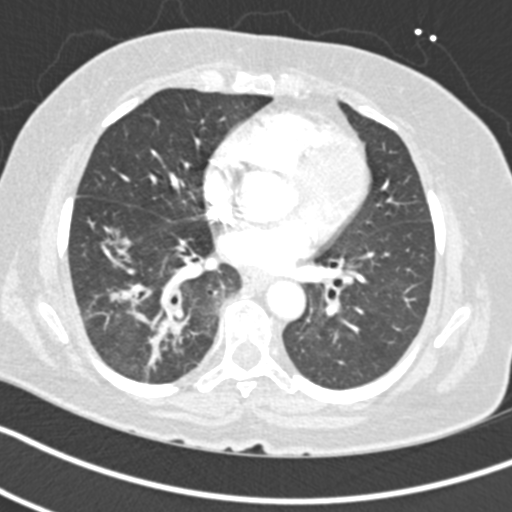
[im 142/272  soft-tissue]
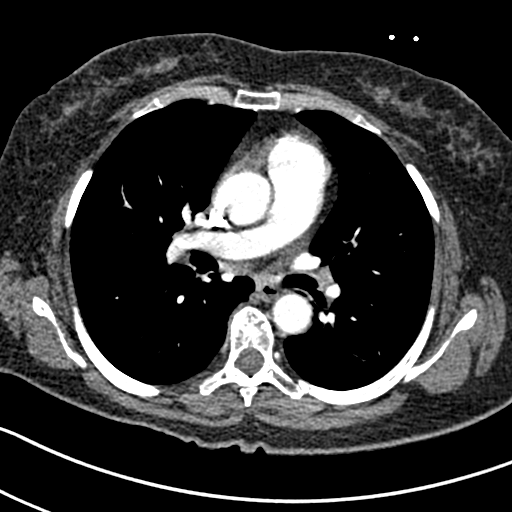
[im 154/272  lung]
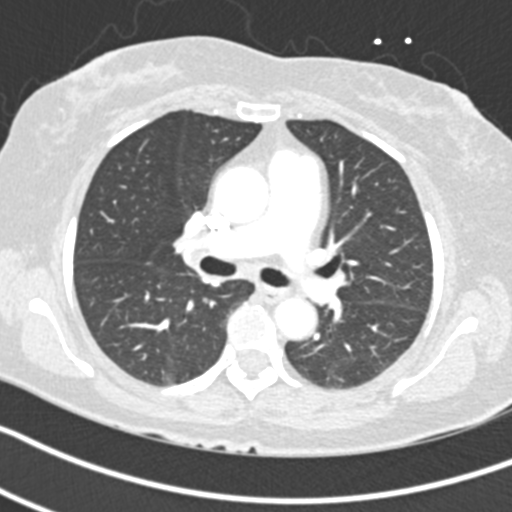
[im 165/272  soft-tissue]
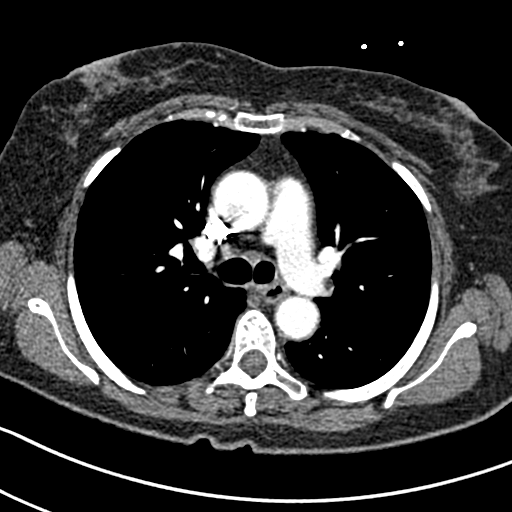
[im 189/272  lung]
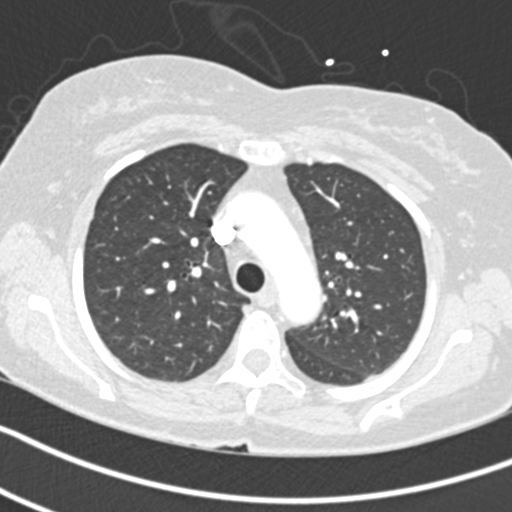
[im 201/272  soft-tissue]
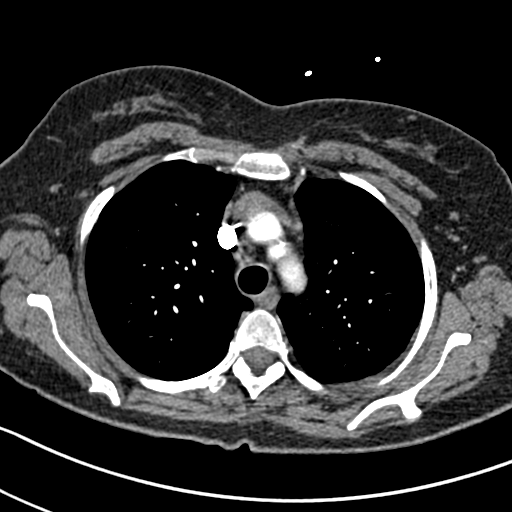
[im 224/272  lung]
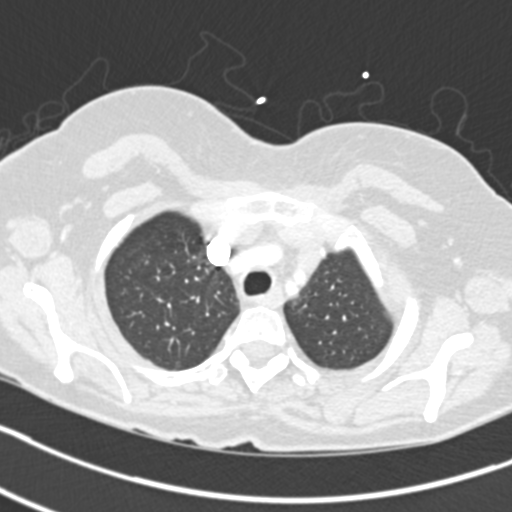
[im 236/272  soft-tissue]
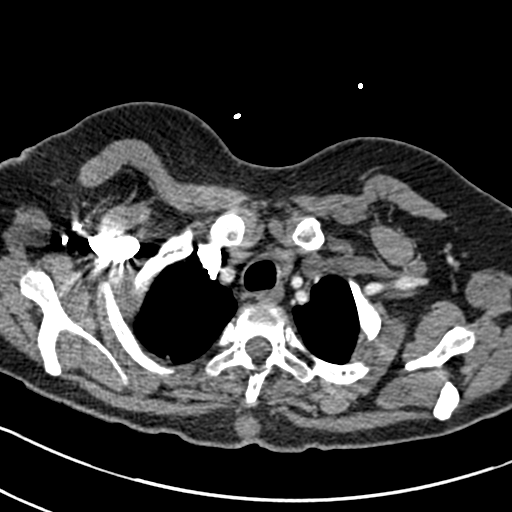
[im 260/272  lung]
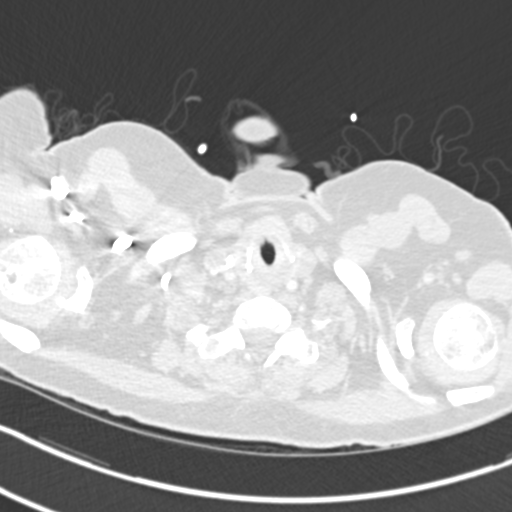

[Series 7: coronal mpr · coronal · 0.60mm/px · 3 of 73 slices shown]
[im 19/73  soft-tissue]
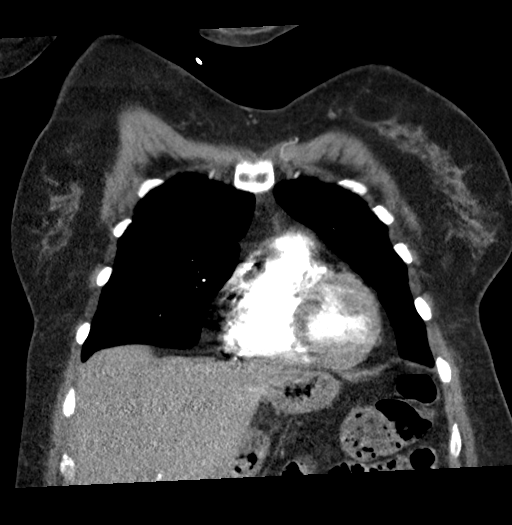
[im 37/73  soft-tissue]
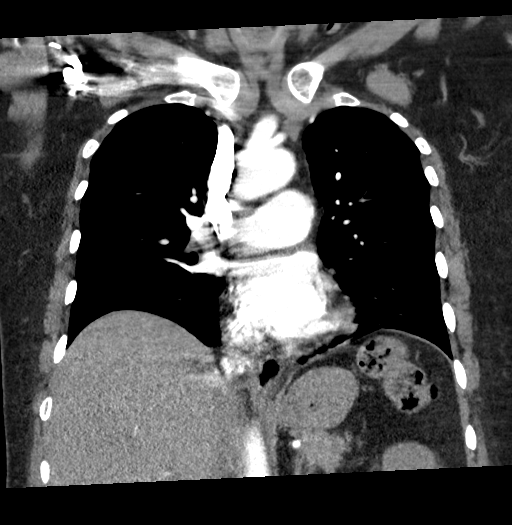
[im 55/73  soft-tissue]
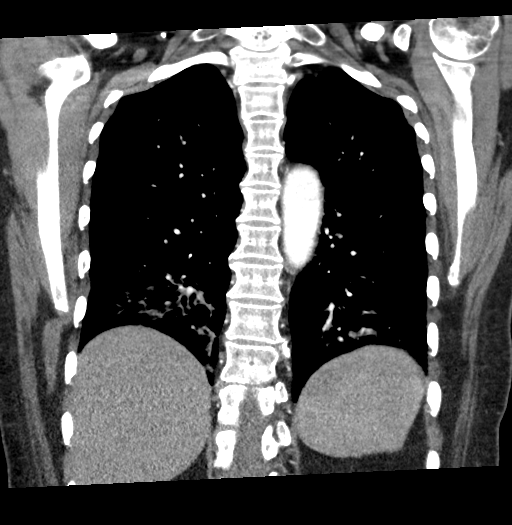

[18 of 46 positions shown; findings below may reference images not displayed]

FINDINGS: Cardiovascular: Satisfactory opacification of the pulmonary
arteries. No central or lobar pulmonary arterial filling defects are
identified. Evaluation for more distal segmental and subsegmental
pulmonary emboli limited by respiratory motion. Central pulmonary
arteries are upper limits normal caliber. Normal heart size. No
pericardial effusion. Three-vessel coronary artery calcifications
are noted. Few calcifications on the aortic leaflets. Minimal plaque
in the aortic arch and great vessels with a shared origin of the
brachiocephalic and left common carotid artery. No acute luminal
abnormality or periaortic stranding or hemorrhage.

Mediastinum/Nodes: No mediastinal fluid or gas. No concerning
thyroid nodules. No acute abnormality of the trachea or esophagus.
No worrisome mediastinal, hilar or axillary adenopathy. Few
subcentimeter low-attenuation lymph nodes in the mediastinum and
hila are favored to be reactive.

Lungs/Pleura: There are areas of mixed consolidative, ground-glass
and tree-in-bud opacity in the lung bases which appears to be
centered upon diffusely thickened and fluid-filled lower lobe
airways bilaterally. No pneumothorax or visible effusion. No
convincing features of edema. No suspicious pulmonary nodules or
masses.

Upper Abdomen: No acute abnormalities present in the visualized
portions of the upper abdomen.

Musculoskeletal: Few small lucent foci present in the right third
and sixth ribs laterally ([DATE], 7). No other acute or suspicious
osseous lesions. No worrisome chest wall masses.

Review of the MIP images confirms the above findings.
IMPRESSION: 1. No central or lobar pulmonary arterial filling defects are
identified to suggest pulmonary embolism. Evaluation for more distal
segmental and subsegmental pulmonary emboli limited by respiratory
motion.
2. Basilar opacities centered upon thickened and fluid-filled
airways in the lower lungs, likely reflecting cute infectious or
inflammatory process including potential aspiration pneumonitis.
3. Few small lucent foci in the right third and sixth ribs
laterally. Appearance is nonspecific in the absence of known
malignancy. Correlate with patient history.
4. Aortic Atherosclerosis (08UM4-XKY.Y).

## 2022-03-10 ENCOUNTER — Encounter (INDEPENDENT_AMBULATORY_CARE_PROVIDER_SITE_OTHER): Payer: Medicare HMO | Admitting: Ophthalmology

## 2022-03-13 NOTE — Unmapped (Signed)
-----   Message from Yehuda Budd, MD sent at 03/12/2022  4:58 PM EST -----  Would you mind notifying the patient that there are no worrisome abnormalities noted on her ziopatch monitor please? I don't see any arrhythmias that would have caused her to pass out. I would not suggest further cardiac testing at this time as long as she's feeling well.    Thx    JK    ----- Message -----  From: Interface, Rad Results In  Sent: 03/12/2022   4:00 PM EST  To: #

## 2022-03-14 NOTE — Unmapped (Signed)
Called and spoke to Ms. Carol Caldwell. Informed her that Dr. Zenaida Deed has reviewed the zio monitor results and he does not see any arrhythmias that would have caused her to pass out and he does not suggest any further cardiac testing at this point as long as she is feeling well. She states she is feeling good at this time.

## 2022-03-17 ENCOUNTER — Encounter (INDEPENDENT_AMBULATORY_CARE_PROVIDER_SITE_OTHER): Payer: Medicare HMO | Admitting: Ophthalmology

## 2022-03-17 DIAGNOSIS — E113312 Type 2 diabetes mellitus with moderate nonproliferative diabetic retinopathy with macular edema, left eye: Secondary | ICD-10-CM

## 2022-03-17 DIAGNOSIS — H43813 Vitreous degeneration, bilateral: Secondary | ICD-10-CM | POA: Diagnosis not present

## 2022-03-17 DIAGNOSIS — E113391 Type 2 diabetes mellitus with moderate nonproliferative diabetic retinopathy without macular edema, right eye: Secondary | ICD-10-CM | POA: Diagnosis not present

## 2022-03-25 ENCOUNTER — Other Ambulatory Visit: Payer: Self-pay | Admitting: Family Medicine

## 2022-03-25 DIAGNOSIS — Z1231 Encounter for screening mammogram for malignant neoplasm of breast: Secondary | ICD-10-CM

## 2022-03-27 ENCOUNTER — Ambulatory Visit
Admission: RE | Admit: 2022-03-27 | Discharge: 2022-03-27 | Disposition: A | Discharge status: Home / Self Care | Payer: Medicare HMO | Source: Ambulatory Visit | Attending: Family Medicine | Admitting: Family Medicine

## 2022-03-27 DIAGNOSIS — Z1231 Encounter for screening mammogram for malignant neoplasm of breast: Secondary | ICD-10-CM | POA: Insufficient documentation

## 2022-04-21 ENCOUNTER — Encounter (INDEPENDENT_AMBULATORY_CARE_PROVIDER_SITE_OTHER): Payer: Medicare HMO | Admitting: Ophthalmology

## 2022-04-25 DIAGNOSIS — L4050 Arthropathic psoriasis, unspecified: Principal | ICD-10-CM

## 2022-04-25 NOTE — Unmapped (Signed)
[facility] Refill Coordination Note    Specialty Medication(s) to be Shipped:   Inflammatory Disorders: Stelara    Other medication(s) to be shipped: No additional medications requested for fill at this time     Carol Caldwell, DOB: [birth year]  Phone: [phone] (home)       All above HIPAA information was verified with patient.     Was a Nurse, learning disability used for this call? No    Completed refill call assessment today to schedule patient's medication shipment from the [facility] [phone]).  All relevant notes have been reviewed.     Specialty medication(s) and dose(s) confirmed: Regimen is correct and unchanged.   Changes to medications: Carol Caldwell reports no changes at this time.  Changes to insurance: No  New side effects reported not previously addressed with a pharmacist or physician: None reported  Questions for the pharmacist: No    Confirmed patient received a Conservation officer, historic buildings and a Surveyor, mining with first shipment. The patient will receive a drug information handout for each medication shipped and additional FDA Medication Guides as required.       DISEASE/MEDICATION-SPECIFIC INFORMATION        For patients on injectable medications: Patient currently has 0 doses left.  Next injection is scheduled for 1/16.    SPECIALTY MEDICATION ADHERENCE     Medication Adherence    Patient reported X missed doses in the last month: 0  Specialty Medication: Stelara  Patient is on additional specialty medications: No  Informant: patient                                Were doses missed due to medication being on hold? No        REFERRAL TO PHARMACIST     Referral to the pharmacist: Not needed      [facility]     Shipping address confirmed in Epic.     Delivery Scheduled: Yes, Expected medication delivery date: 1/16.     Medication will be delivered via Same Day Courier to the prescription address in Epic WAM.    Julianne Rice, PharmD   [facility] Specialty Pharmacist

## 2022-04-29 MED FILL — STELARA 45 MG/0.5 ML SUBCUTANEOUS SYRINGE: SUBCUTANEOUS | 56 days supply | Qty: 0.5 | Fill #3

## 2022-06-12 NOTE — Unmapped (Signed)
[facility] Refill Coordination Note    Specialty Medication(s) to be Shipped:   Inflammatory Disorders: Stelara    Other medication(s) to be shipped: No additional medications requested for fill at this time     Carol Caldwell, DOB: 1955/05/22  Phone: [phone] (home)       All above HIPAA information was verified with patient.     Was a Nurse, learning disability used for this call? No    Completed refill call assessment today to schedule patient's medication shipment from the [facility] [phone]).  All relevant notes have been reviewed.     Specialty medication(s) and dose(s) confirmed: Regimen is correct and unchanged.   Changes to medications: [name] reports no changes at this time.  Changes to insurance: No  New side effects reported not previously addressed with a pharmacist or physician: None reported  Questions for the pharmacist: No    Confirmed patient received a Conservation officer, historic buildings and a Surveyor, mining with first shipment. The patient will receive a drug information handout for each medication shipped and additional FDA Medication Guides as required.       DISEASE/MEDICATION-SPECIFIC INFORMATION        For patients on injectable medications: Patient currently has 0 doses left.  Next injection is scheduled for 03/12.    SPECIALTY MEDICATION ADHERENCE     Medication Adherence    Patient reported X missed doses in the last month: 0  Specialty Medication: STELARA 45 mg/0.5 mL Syrg syringe (ustekinumab)  Patient is on additional specialty medications: No  Patient is on more than two specialty medications: No  Any gaps in refill history greater than 2 weeks in the last 3 months: no  Demonstrates understanding of importance of adherence: yes  Informant: patient  Reliability of informant: reliable  Provider-estimated medication adherence level: good  Patient is at risk for Non-Adherence: No  Reasons for non-adherence: no problems identified  Confirmed plan for next specialty medication refill: delivery by pharmacy  Refills needed for supportive medications: not needed          Refill Coordination    Has the Patients' Contact Information Changed: No  Is the Shipping Address Different: No         Were doses missed due to medication being on hold? No    stelara 45/0.5 mg/ml: 0 days of medicine on hand       REFERRAL TO PHARMACIST     Referral to the pharmacist: Not needed      [facility]     Shipping address confirmed in Epic.     Patient was notified of new phone menu : Yes    Delivery Scheduled: Yes, Expected medication delivery date: 03/07.     Medication will be delivered via Same Day Courier to the prescription address in Epic WAM.    Antonietta Barcelona   [facility] Specialty Technician

## 2022-06-19 MED FILL — STELARA 45 MG/0.5 ML SUBCUTANEOUS SYRINGE: SUBCUTANEOUS | 56 days supply | Qty: 0.5 | Fill #4

## 2022-08-05 NOTE — Unmapped (Signed)
[facility] Refill Coordination Note    Specialty Medication(s) to be Shipped:   Inflammatory Disorders: Stelara    Other medication(s) to be shipped: No additional medications requested for fill at this time     Carol Caldwell, DOB: 08/04/1955  Phone: [phone] (home)       All above HIPAA information was verified with patient.     Was a Nurse, learning disability used for this call? No    Completed refill call assessment today to schedule patient's medication shipment from the [facility] [phone]).  All relevant notes have been reviewed.     Specialty medication(s) and dose(s) confirmed: Regimen is correct and unchanged.   Changes to medications: Jalissa reports no changes at this time.  Changes to insurance: No  New side effects reported not previously addressed with a pharmacist or physician: None reported  Questions for the pharmacist: No    Confirmed patient received a Conservation officer, historic buildings and a Surveyor, mining with first shipment. The patient will receive a drug information handout for each medication shipped and additional FDA Medication Guides as required.       DISEASE/MEDICATION-SPECIFIC INFORMATION        For patients on injectable medications: Patient currently has 0 doses left.  Next injection is scheduled for 5/7.    SPECIALTY MEDICATION ADHERENCE     Medication Adherence    Patient reported X missed doses in the last month: 0  Specialty Medication: STELARA 45 mg/0.5 mL Syrg syringe (ustekinumab)  Patient is on additional specialty medications: No              Were doses missed due to medication being on hold? No    Stelara 45/0.5 mg/ml: 0 days of medicine on hand        REFERRAL TO PHARMACIST     Referral to the pharmacist: Not needed      [facility]     Shipping address confirmed in Epic.       Delivery Scheduled: Yes, Expected medication delivery date: 08/12/22.     Medication will be delivered via Same Day Courier to the prescription address in Epic WAM.    Willette Pa   [facility] Specialty Technician

## 2022-08-12 MED FILL — STELARA 45 MG/0.5 ML SUBCUTANEOUS SYRINGE: SUBCUTANEOUS | 56 days supply | Qty: 0.5 | Fill #5

## 2022-08-19 ENCOUNTER — Ambulatory Visit: Admit: 2022-08-19 | Discharge: 2022-08-20 | Payer: MEDICARE

## 2022-08-19 DIAGNOSIS — D539 Nutritional anemia, unspecified: Principal | ICD-10-CM

## 2022-08-19 DIAGNOSIS — R000 Tachycardia, unspecified: Principal | ICD-10-CM

## 2022-08-19 DIAGNOSIS — E785 Hyperlipidemia, unspecified: Principal | ICD-10-CM

## 2022-08-19 DIAGNOSIS — E1122 Type 2 diabetes mellitus with diabetic chronic kidney disease: Principal | ICD-10-CM

## 2022-08-19 DIAGNOSIS — R55 Syncope and collapse: Principal | ICD-10-CM

## 2022-08-19 DIAGNOSIS — E559 Vitamin D deficiency, unspecified: Principal | ICD-10-CM

## 2022-08-19 DIAGNOSIS — N1831 Type 2 diabetes mellitus with stage 3a chronic kidney disease, without long-term current use of insulin (CMS-HCC): Principal | ICD-10-CM

## 2022-08-19 LAB — ADDON DIFFERENTIAL ONLY
BASOPHILS ABSOLUTE COUNT: 0 10*9/L (ref 0.0–0.1)
BASOPHILS RELATIVE PERCENT: 0.9 %
EOSINOPHILS ABSOLUTE COUNT: 0 10*9/L (ref 0.0–0.5)
EOSINOPHILS RELATIVE PERCENT: 0.8 %
LYMPHOCYTES ABSOLUTE COUNT: 1.5 10*9/L (ref 1.1–3.6)
LYMPHOCYTES RELATIVE PERCENT: 36.4 %
MONOCYTES ABSOLUTE COUNT: 0.5 10*9/L (ref 0.3–0.8)
MONOCYTES RELATIVE PERCENT: 11.2 %
NEUTROPHILS ABSOLUTE COUNT: 2.1 10*9/L (ref 1.8–7.8)
NEUTROPHILS RELATIVE PERCENT: 50.7 %

## 2022-08-19 LAB — CBC
HEMATOCRIT: 28.8 % — ABNORMAL LOW (ref 34.0–44.0)
HEMOGLOBIN: 9.7 g/dL — ABNORMAL LOW (ref 11.3–14.9)
MEAN CORPUSCULAR HEMOGLOBIN CONC: 33.7 g/dL (ref 32.0–36.0)
MEAN CORPUSCULAR HEMOGLOBIN: 34.8 pg — ABNORMAL HIGH (ref 25.9–32.4)
MEAN CORPUSCULAR VOLUME: 103.1 fL — ABNORMAL HIGH (ref 77.6–95.7)
MEAN PLATELET VOLUME: 7.6 fL (ref 6.8–10.7)
PLATELET COUNT: 255 10*9/L (ref 150–450)
RED BLOOD CELL COUNT: 2.8 10*12/L — ABNORMAL LOW (ref 3.95–5.13)
RED CELL DISTRIBUTION WIDTH: 16.2 % — ABNORMAL HIGH (ref 12.2–15.2)
WBC ADJUSTED: 4.3 10*9/L (ref 3.6–11.2)

## 2022-08-19 LAB — TSH: THYROID STIMULATING HORMONE: 1.991 u[IU]/mL (ref 0.550–4.780)

## 2022-08-19 LAB — COMPREHENSIVE METABOLIC PANEL
ALBUMIN: 3.2 g/dL — ABNORMAL LOW (ref 3.4–5.0)
ALKALINE PHOSPHATASE: 78 U/L (ref 46–116)
ALT (SGPT): 14 U/L (ref 10–49)
ANION GAP: 8 mmol/L (ref 5–14)
AST (SGOT): 31 U/L (ref <=34)
BILIRUBIN TOTAL: 0.3 mg/dL (ref 0.3–1.2)
BLOOD UREA NITROGEN: 29 mg/dL — ABNORMAL HIGH (ref 9–23)
BUN / CREAT RATIO: 31
CALCIUM: 9.7 mg/dL (ref 8.7–10.4)
CHLORIDE: 102 mmol/L (ref 98–107)
CO2: 31 mmol/L (ref 20.0–31.0)
CREATININE: 0.93 mg/dL
EGFR CKD-EPI (2021) FEMALE: 68 mL/min/{1.73_m2} (ref >=60)
GLUCOSE RANDOM: 160 mg/dL — ABNORMAL HIGH (ref 70–99)
POTASSIUM: 3.3 mmol/L — ABNORMAL LOW (ref 3.4–4.8)
PROTEIN TOTAL: 7 g/dL (ref 5.7–8.2)
SODIUM: 141 mmol/L (ref 135–145)

## 2022-08-19 LAB — LIPID PANEL
CHOLESTEROL/HDL RATIO SCREEN: 2 (ref 1.0–4.5)
CHOLESTEROL: 196 mg/dL (ref <=200)
HDL CHOLESTEROL: 99 mg/dL — ABNORMAL HIGH (ref 40–60)
LDL CHOLESTEROL CALCULATED: 81 mg/dL (ref 40–99)
NON-HDL CHOLESTEROL: 97 mg/dL (ref 70–130)
TRIGLYCERIDES: 82 mg/dL (ref 0–150)
VLDL CHOLESTEROL CAL: 16.4 mg/dL (ref 11–41)

## 2022-08-19 LAB — RETICULOCYTES
RETICULOCYTE ABSOLUTE COUNT: 49.2 10*9/L (ref 23.0–100.0)
RETICULOCYTE COUNT PCT: 1.8 % (ref 0.50–2.17)

## 2022-08-19 LAB — HEMOGLOBIN A1C
ESTIMATED AVERAGE GLUCOSE: 131 mg/dL
HEMOGLOBIN A1C: 6.2 % — ABNORMAL HIGH (ref 4.8–5.6)

## 2022-08-19 NOTE — Unmapped (Signed)
We discussed monitoring your blood pressure and maintaining a blood pressure log.  I would suggest sitting and relaxing for at least 5 minutes, then take 3 consecutive BP measurements.   Record these measurements in a log. Please call our office Friday or Monday and give the readings to whomever answers the phone and they can send to me.   Our goal is to have most of your blood pressures <130/80.     To obtain a reliable blood pressure reading at home, please follow these recommendations:    Find a time when you are not rushed or anxious.     Avoid caffeine for 30 minutes before the test    Sit in comfortable chair, leaning against chair back, with arm supported about 45 degrees away from you side (on chair arm, kitchen table, etc.)    Keep legs uncrossed and feet flat on ground    Wait 5 minutes before checking the pressure    Ideally collect three readings about 1-3 minutes apart and average the tops and bottoms to generate the recorded blood pressure

## 2022-08-19 NOTE — Unmapped (Signed)
Assessment and Plan:   Carol Caldwell is a 67 y.o. female with a history of diabetes, psoriatic arthritis and hyperlipidemia who presents in clinic today for follow-up.    1.  Syncope  Patient with recent episode of syncope.  Etiology unclear.  Previous episode last fall occurred after arising from bed suggesting orthostatic hypotension. This episode occurred while she was lying on the couch and somehow caused her to wind up on the floor. She really does not think she tried to get up prior to passing out. Falling off the couch during syncope seems unusual to me.  No sz like activity.   Electrocardiogram today shows sinus rhythm, mildly increased QTc.   Echo and zio placed after Nov syncopal episode were unrevealing.   Discussed placing another holter, briefly introduced concpet of ILR.   Check labs as below today. She will notify us of recurrent symptoms.       2.  Hyperlipidemia  Reviewed labs 10/2021, LFTs unremarkable and non-HDL well controlled at 88.  Continue rosuvastatin 40 mg daily.    3. Chronic LE edema, hypoK, HTN  Reports years of LE edema. May be a little worse recently. Uses lasix prn - last use was a couple of weeks ago. Needs refill.   BP is elevated in office today. She will log at home - instructions provided.   K+ noted to be low today. Will add spiro for BP, edema and K+. Recheck in 2-3 weeks.     Will also check labs for DM, Vit D Def, TSH, CBC and send to PCP to avoid duplicate phlebotomy at upcoming appt.     Orders Placed This Encounter   Procedures    Comprehensive Metabolic Panel    Hemoglobin A1c    Lipid Panel    Vitamin D 25 Hydroxy (25OH D2 + D3)    CBC    TSH    External ECG-8 days to 15 days (ZIO XT)    ECG 12 Lead       Requested Prescriptions     Signed Prescriptions Disp Refills    spironolactone (ALDACTONE) 25 MG tablet 30 tablet 6     Sig: Take 1 tablet (25 mg total) by mouth daily.    furosemide (LASIX) 40 MG tablet 30 tablet 1     Sig: Take 1 tablet by mouth daily as needed for swelling         Return for print AVS; labs today,  f/u to be determined .      I personally spent greater than 30 minutes in face-to-face and non-face-to-face care of this patient, which includes all pre, intra, and post visit time on the date of service.      At least part, if not all, of this note has been dictated using [employer] account planner voice recognition software and as such may contain typographical errors not appreciated during proofreading.         Subjective:   PCP: [facility] [place]  Patient: Carol Caldwell  DOB: 10-07-1955    Reason for visit:  follow up, syncope   HPI: Carol Caldwell is a 67 y.o. female with a history of diabetes who presents in clinic today for follow-up.      At last visit with Dr. Zenaida Deed in Nov, had reported an episode of syncope after getting out of bed in the night. Etiology unclear holter and echo ordered - no clear abnormalities on either.   Reports she ahd been doing well  w/o any recurrence until a month ago, she experienced another syncopal episode. This time, she had been lying on the couch and the next thing she knew, her daughter was calling her name and hse was on the floor. She doesn't recall trying to stand/sit up before this happened. Her daughter told her she heard a thump and found her on the ground. No sz activity, post ictal period, incontinence.   BS have not been low. She hadn't eaten when this happened between 9-10am but typically doesn't eat breakfast.     No other episodes and she states she feels really good.     She takes lasix 1-2 times a month when chronic LE edema is worse.   Last dose was a couple of weeks ago. Needs refill.       She denies any chest pain, shortness of breath, palpitations or other pertinent complaints.     Sees her PCP on the 22nd.     ______________________________________________________________________    Pertinent Medical History, Cardiovascular History & Procedures:    Pertinent PMH:  Diabetes  Psoriatic arthritis  Vitamin D deficiency    Cath / PCI:  None    CV Surgery:   None    EP Procedures and Devices:  None    Non-Invasive Evaluation(s):  ZIO monitor November' 23: Underlying sinus average rate 95 bpm; rare PACs 5 episodes of SVT longest lasting 10 beats at 143 bpm, rare PVCs, no wide-complex tachycardia; symptoms were associated with sinus rhythm, PACs, PVCs.  TTE November' 23: Mildly increased wall thickness, EF 55% sign; trivial MR and mild mitral annular calcification  Myocardial perfusion study 7/19: Equivocal due to marked adjacent gut activity overlying inferolateral segments.  No significant ischemia is appreciated involving any other segments  Stress echocardiogram 1/19: No echocardiographic evidence of inducible ischemia but patient only exercised 3 minutes and 6 seconds on Bruce protocol  6/18: Normal EF, aortic sclerosis, dilated PA, mild pulmonary hypertension    ______________________________________________________________________    Other past medical history, social history, family history, medications, allergies and problem list reviewed in the medical record.    Current cardiac medications include:  Furosemide 40 mg as needed  Jardiance 10 mg daily  Rosuvastatin 40 mg daily  Start spironolactone 25mg  daily       Objective:     BP 144/78 (BP Site: R Arm, BP Position: Sitting, BP Cuff Size: Medium)  - Pulse 83  - Wt 75.8 kg (167 lb)  - SpO2 97%  - BMI 26.16 kg/m??     Wt Readings from Last 12 Encounters:   08/19/22 75.8 kg (167 lb)   02/17/22 70.8 kg (156 lb)   12/19/21 73 kg (161 lb)   08/16/21 74.4 kg (164 lb)   05/15/21 74.4 kg (164 lb)   02/01/21 72.6 kg (160 lb)   01/23/21 75.8 kg (167 lb)   10/23/20 75.8 kg (167 lb 3.2 oz)   10/03/20 78.5 kg (173 lb)   06/28/20 77.6 kg (171 lb)   04/23/20 78.9 kg (174 lb)   10/25/19 74.4 kg (164 lb)         PHYSICAL EXAMINATION:   GENERAL:  Alert, NAD  CARDIOVASCULAR:  Regular rate and rhythm, normal S1/S2, no murmurs, rubs, or gallops. Tr pitting superimposed on nonpitting  LE edema.  RESPIRATORY:  Clear to auscultation bilaterally.  No wheezes, crackles, or rhonchi. Normal work of breathing.    ______________________________________________________________________    EKG today shows sinus rhythm, normal axis,prolonged QTc at ,  and no diagnostic ischemic changes.   Since last tracing 02/17/22 rate has decreased by 10bpm. QTc increased by 21ms       Recent CV pertinent labs:  Lab Results   Component Value Date    LDL Calculated 81 08/19/2022    Non-HDL Cholesterol 97 08/19/2022    HDL 99 (H) 08/19/2022    Creatinine 0.93 08/19/2022    Creatinine 0.8 10/04/2018    Creatinine Whole Blood, POC 0.7 05/16/2016    Potassium 3.3 (L) 08/19/2022    Potassium 3.8 10/04/2018    BUN 29 (H) 08/19/2022    BUN 16.00 10/04/2018    TSH 1.991 08/19/2022

## 2022-08-19 NOTE — Unmapped (Signed)
Zio Patch monitor explained;  instructions given, including to call [employer] for any difficulties; questions answered; skin prepped; monitor placed; pt initiated; registered with [employer].    #__[id number]__Pt advised to call [employer] for problems @ [phone].

## 2022-08-20 DIAGNOSIS — E876 Hypokalemia: Principal | ICD-10-CM

## 2022-08-20 LAB — MAGNESIUM: MAGNESIUM: 1.4 mg/dL — ABNORMAL LOW (ref 1.6–2.6)

## 2022-08-20 LAB — VITAMIN B12: VITAMIN B-12: 321 pg/mL (ref 211–911)

## 2022-08-20 LAB — FOLATE: FOLATE: 5.8 ng/mL (ref >=5.4)

## 2022-08-20 MED ORDER — FUROSEMIDE 40 MG TABLET
ORAL_TABLET | 1 refills | 0 days | Status: CP
Start: 2022-08-20 — End: ?

## 2022-08-20 MED ORDER — SPIRONOLACTONE 25 MG TABLET
ORAL_TABLET | Freq: Every day | ORAL | 6 refills | 30 days | Status: CP
Start: 2022-08-20 — End: 2023-08-20

## 2022-08-20 NOTE — Unmapped (Signed)
Called Carol Caldwell and informed her of:   Hgb A1c is 6.2%   - potassium is too low. I'd like to start a medication for her BP that will help with potassium, BP and swelling. It's called spironolactone. She'll take 1 tablet a day. Should recheck BP and labs in 2-3 weeks in the office with me. Will need to make appt  - she is anemic. Wasn't last year. I've added on some labs to evaluate this a little more but she will need to tell her PCP about the anemia when she sees her/him at upcoming visit so they can plan next steps.   - cholesterol acceptable.   - Thyroid normal.    Appointment for follow up was made.

## 2022-08-20 NOTE — Unmapped (Signed)
-----   Message from Adrian Prows, [place] sent at 08/20/2022  2:22 PM EDT -----  Will you please let pt know about lab results please? She may need to take notes.    - Hgb A1c is 6.2%   - potassium is too low. I'd like to start a medication for her BP that will help with potassium, BP and swelling. It's called spironolactone. She'll take 1 tablet a day. Should recheck BP and labs in 2-3 weeks in the office with me. Will need to make appt  - she is anemic. Wasn't last year. I've added on some labs to evaluate this a little more but she will need to tell her PCP about the anemia when she sees her/him at upcoming visit so they can plan next steps.   - cholesterol acceptable.   - Thyroid normal.

## 2022-08-22 NOTE — Unmapped (Signed)
Recent labs faxed to [facility] at [phone].

## 2022-08-22 NOTE — Unmapped (Signed)
I spoke with Ms. Carol Caldwell to update her on lab results from her appointment earlier this week. As per Carol Caldwell, the magnesium has reported as too low. This may be a cause for potassium being low. Ms. Carol Caldwell was asked to follow up with her PCP and labs were faxed to [facility] at [phone]. Patient was instructed to take an over the counter magnesium supplement (one tablet twice daily) until seeing her  PCP. This may cause stomach discomfort or diarrhea, in which case it can be stopped and update PCP. Patient verbalized understanding and all questions answered at this time.

## 2022-08-23 LAB — VITAMIN D 25 HYDROXY: VITAMIN D, TOTAL (25OH): 9.8 ng/mL — ABNORMAL LOW (ref 20.0–80.0)

## 2022-08-28 NOTE — Unmapped (Signed)
I spoke with Carol Caldwell to inform her that her Vitamin D lab results reported as low. The results have been faxed to her PCP [facility]. Patient verbalized understanding and all questions answered at this time.

## 2022-09-08 NOTE — Unmapped (Unsigned)
Assessment and Plan:   Carol Caldwell is a 67 y.o. female with a history of diabetes, psoriatic arthritis and hyperlipidemia who presents in clinic today for follow-up.      PVP:   Was seen earlier this month for syncope   Placed holter  - zio not back yet   Started spiro for hypoK and BP     Did she follow up with PCP about Vit D?    PLAN:   Repeat BMP today       Notes from previous visit.     1.  Syncope  Patient with recent episode of syncope.  Etiology unclear.  Previous episode last fall occurred after arising from bed suggesting orthostatic hypotension. This episode occurred while she was lying on the couch and somehow caused her to wind up on the floor. She really does not think she tried to get up prior to passing out. Falling off the couch during syncope seems unusual to me.  No sz like activity.   Electrocardiogram today shows sinus rhythm, mildly increased QTc.   Echo and zio placed after Nov syncopal episode were unrevealing.   Discussed placing another holter, briefly introduced concpet of ILR.   Check labs as below today. She will notify us of recurrent symptoms.       2.  Hyperlipidemia  Reviewed labs 10/2021, LFTs unremarkable and non-HDL well controlled at 88.  Continue rosuvastatin 40 mg daily.    3. Chronic LE edema, hypoK, HTN  Reports years of LE edema. May be a little worse recently. Uses lasix prn - last use was a couple of weeks ago. Needs refill.   BP is elevated in office today. She will log at home - instructions provided.   K+ noted to be low today. Will add spiro for BP, edema and K+. Recheck in 2-3 weeks.     Will also check labs for DM, Vit D Def, TSH, CBC and send to PCP to avoid duplicate phlebotomy at upcoming appt.     No orders of the defined types were placed in this encounter.      Requested Prescriptions      No prescriptions requested or ordered in this encounter         No follow-ups on file.      I personally spent greater than 30 minutes in face-to-face and non-face-to-face care of this patient, which includes all pre, intra, and post visit time on the date of service.      At least part, if not all, of this note has been dictated using [employer] account planner voice recognition software and as such may contain typographical errors not appreciated during proofreading.         Subjective:   PCP: [facility] [place]  Patient: Carol Caldwell  DOB: [birth year]    Reason for visit:  follow up, syncope   HPI: Carol Caldwell is a 67 y.o. female with a history of diabetes who presents in clinic today for follow-up.      At last visit with Dr. Zenaida Deed in Nov, had reported an episode of syncope after getting out of bed in the night. Etiology unclear holter and echo ordered - no clear abnormalities on either.   Reports she ahd been doing well w/o any recurrence until a month ago, she experienced another syncopal episode. This time, she had been lying on the couch and the next thing she knew, her daughter was calling her name and hse was on  the floor. She doesn't recall trying to stand/sit up before this happened. Her daughter told her she heard a thump and found her on the ground. No sz activity, post ictal period, incontinence.   BS have not been low. She hadn't eaten when this happened between 9-10am but typically doesn't eat breakfast.     No other episodes and she states she feels really good.     She takes lasix 1-2 times a month when chronic LE edema is worse.   Last dose was a couple of weeks ago. Needs refill.       She denies any chest pain, shortness of breath, palpitations or other pertinent complaints.     Sees her PCP on the 22nd.     ______________________________________________________________________    Pertinent Medical History, Cardiovascular History & Procedures:    Pertinent PMH:  Diabetes  Psoriatic arthritis  Vitamin D deficiency    Cath / PCI:  None    CV Surgery:   None    EP Procedures and Devices:  None    Non-Invasive Evaluation(s):  ZIO monitor November' 23: Underlying sinus average rate 95 bpm; rare PACs 5 episodes of SVT longest lasting 10 beats at 143 bpm, rare PVCs, no wide-complex tachycardia; symptoms were associated with sinus rhythm, PACs, PVCs.  TTE November' 23: Mildly increased wall thickness, EF 55% sign; trivial MR and mild mitral annular calcification  Myocardial perfusion study 7/19: Equivocal due to marked adjacent gut activity overlying inferolateral segments.  No significant ischemia is appreciated involving any other segments  Stress echocardiogram 1/19: No echocardiographic evidence of inducible ischemia but patient only exercised 3 minutes and 6 seconds on Bruce protocol  6/18: Normal EF, aortic sclerosis, dilated PA, mild pulmonary hypertension    ______________________________________________________________________    Other past medical history, social history, family history, medications, allergies and problem list reviewed in the medical record.    Current cardiac medications include:  Furosemide 40 mg as needed  Jardiance 10 mg daily  Rosuvastatin 40 mg daily  Start spironolactone 25mg  daily       Objective:     There were no vitals taken for this visit.    Wt Readings from Last 12 Encounters:   08/19/22 75.8 kg (167 lb)   02/17/22 70.8 kg (156 lb)   12/19/21 73 kg (161 lb)   08/16/21 74.4 kg (164 lb)   05/15/21 74.4 kg (164 lb)   02/01/21 72.6 kg (160 lb)   01/23/21 75.8 kg (167 lb)   10/23/20 75.8 kg (167 lb 3.2 oz)   10/03/20 78.5 kg (173 lb)   06/28/20 77.6 kg (171 lb)   04/23/20 78.9 kg (174 lb)   10/25/19 74.4 kg (164 lb)         PHYSICAL EXAMINATION:   GENERAL:  Alert, NAD  CARDIOVASCULAR:  Regular rate and rhythm, normal S1/S2, no murmurs, rubs, or gallops. Tr pitting superimposed on nonpitting  LE edema.  RESPIRATORY:  Clear to auscultation bilaterally.  No wheezes, crackles, or rhonchi. Normal work of breathing.    ______________________________________________________________________    EKG 08/16/22 shows sinus rhythm, normal axis,prolonged QTc at ,  and no diagnostic ischemic changes.   Since last tracing 02/17/22 rate has decreased by 10bpm. QTc increased by 21ms       Recent CV pertinent labs:  Lab Results   Component Value Date    LDL Calculated 81 08/19/2022    Non-HDL Cholesterol 97 08/19/2022    HDL 99 (H) 08/19/2022    Creatinine  0.93 08/19/2022    Creatinine 0.8 10/04/2018    Creatinine Whole Blood, POC 0.7 05/16/2016    Potassium 3.3 (L) 08/19/2022    Potassium 3.8 10/04/2018    BUN 29 (H) 08/19/2022    BUN 16.00 10/04/2018    TSH 1.991 08/19/2022

## 2022-09-15 NOTE — Unmapped (Unsigned)
Assessment and Plan:   Carol Caldwell is a 67 y.o. female with a history of diabetes, psoriatic arthritis and hyperlipidemia who presents in clinic today for follow-up.    HTN   BP remains elevated. Check BMP today.   On chart review, has been on enalapril in the past, unclear why it was stopped. If she can't recall, would restart at 5mg  daily and follow up in 2-3 weeks to assess response and labs.     2.  Syncope  No recurrence since last visit.   Holter last month w/o concerning arrhythmia. Etiology unclear.  Previous episode last Fall occurred after arising from bed suggesting orthostatic hypotension. Most episode May '24 occurred while she was lying on the couch and somehow caused her to wind up on the floor. She really does not think she tried to get up prior to passing out. Falling off the couch during syncope seems unusual to me.  No sz like activity.   Electrocardiogram last visit showed sinus rhythm, mildly increased QTc. No carotid bruit. TTE from Nov '23 done for syncope was without abnormality that would cause syncope.   For now, arise slowly from seated/supine position, stay hydrated. Alert [place] to recurrent symptoms.       3.  Hyperlipidemia  Reviewed labs May '24 , LFTs unremarkable and non-HDL well controlled at 97.  Continue rosuvastatin 40 mg daily.    4. hypoK, hypoMg, LE edema   K + and Mg noted to be low last visit. Unclear cause - lasix is PRN and last use was a couple of weeks prior to labs. Cleda Daub added for HTN and hypoK at that time as well as OTC Mg recommended.     repeat BMP today.  Edema improved since spiro added.         Orders Placed This Encounter   Procedures    Basic Metabolic Panel       Requested Prescriptions      No prescriptions requested or ordered in this encounter         Return in about 1 day (around 09/17/2022) for 3-4 months f/u with JJK; labs today, print AVS .      I personally spent greater than 30 minutes in face-to-face and non-face-to-face care of this patient, which includes all pre, intra, and post visit time on the date of service.      At least part, if not all, of this note has been dictated using [employer] account planner voice recognition software and as such may contain typographical errors not appreciated during proofreading.         Subjective:   PCP: [facility] [place]  Patient: Carol Caldwell  DOB: 1955/06/10    Reason for visit:  follow up edema, HTN   HPI: Carol Caldwell is a 67 y.o. female with a history of diabetes who presents in clinic today for follow-up.      Tolerating spironolactone added at last visit w/o difficulty. Swelling is better , legs aren't as sore as they were. Hasn't' used her PRN lasix in 2 weeks.  She denies any recurrent lightheadedness, presyncope/syncope.   Home BPs have been  alittle high - as low as 133 once, otherwise 140s-150s/70s.    Missed her PCP appt - has to call beck tomorrow to get rescheduled.     She reports feeling well and has no other complaints today.      ______________________________________________________________________    Pertinent Medical History, Cardiovascular History & Procedures:  Pertinent PMH:  Diabetes  Psoriatic arthritis  Vitamin D deficiency    Cath / PCI:  None    CV Surgery:   None    EP Procedures and Devices:  None    Non-Invasive Evaluation(s):  ZIO monitor November' 23: Underlying sinus average rate 95 bpm; rare PACs 5 episodes of SVT longest lasting 10 beats at 143 bpm, rare PVCs, no wide-complex tachycardia; symptoms were associated with sinus rhythm, PACs, PVCs.  TTE November' 23: Mildly increased wall thickness, EF 55% sign; trivial MR and mild mitral annular calcification  Myocardial perfusion study 7/19: Equivocal due to marked adjacent gut activity overlying inferolateral segments.  No significant ischemia is appreciated involving any other segments  Stress echocardiogram 1/19: No echocardiographic evidence of inducible ischemia but patient only exercised 3 minutes and 6 seconds on Bruce protocol  6/18: Normal EF, aortic sclerosis, dilated PA, mild pulmonary hypertension    ______________________________________________________________________    Other past medical history, social history, family history, medications, allergies and problem list reviewed in the medical record.    Current cardiac medications include:  Furosemide 40 mg as needed  Jardiance 10 mg daily  Rosuvastatin 40 mg daily  spironolactone 25mg  daily   New today - enalapril 5mg  every day        Objective:     BP 150/72 Comment: manual check - Pulse 87  - Wt 74.8 kg (165 lb)  - SpO2 98%  - BMI 25.84 kg/m??     Wt Readings from Last 12 Encounters:   09/16/22 74.8 kg (165 lb)   08/19/22 75.8 kg (167 lb)   02/17/22 70.8 kg (156 lb)   12/19/21 73 kg (161 lb)   08/16/21 74.4 kg (164 lb)   05/15/21 74.4 kg (164 lb)   02/01/21 72.6 kg (160 lb)   01/23/21 75.8 kg (167 lb)   10/23/20 75.8 kg (167 lb 3.2 oz)   10/03/20 78.5 kg (173 lb)   06/28/20 77.6 kg (171 lb)   04/23/20 78.9 kg (174 lb)         PHYSICAL EXAMINATION:   GENERAL:  Alert, NAD  CARDIOVASCULAR:  Regular rate and rhythm, normal S1/S2, no murmurs, rubs, or gallops. Tr pitting superimposed on nonpitting  LE edema.  RESPIRATORY:  Clear to auscultation bilaterally.  No wheezes, crackles, or rhonchi. Normal work of breathing.    ______________________________________________________________________    EKG 08/16/22 shows sinus rhythm, normal axis,prolonged QTc at ,  and no diagnostic ischemic changes.   Since last tracing 02/17/22 rate has decreased by 10bpm. QTc increased by 21ms       Recent CV pertinent labs:  Lab Results   Component Value Date    LDL Calculated 81 08/19/2022    Non-HDL Cholesterol 97 08/19/2022    HDL 99 (H) 08/19/2022    Creatinine 1.10 09/16/2022    Creatinine 0.8 10/04/2018    Creatinine Whole Blood, POC 0.7 05/16/2016    Potassium 4.2 09/16/2022    Potassium 3.8 10/04/2018    BUN 28 (H) 09/16/2022    BUN 16.00 10/04/2018    TSH 1.991 08/19/2022 10/04/2018    TSH 1.991 08/19/2022

## 2022-09-16 ENCOUNTER — Ambulatory Visit: Admit: 2022-09-16 | Discharge: 2022-09-17 | Payer: MEDICARE

## 2022-09-16 DIAGNOSIS — R600 Localized edema: Principal | ICD-10-CM

## 2022-09-16 DIAGNOSIS — E876 Hypokalemia: Principal | ICD-10-CM

## 2022-09-16 DIAGNOSIS — Z5181 Encounter for therapeutic drug level monitoring: Principal | ICD-10-CM

## 2022-09-16 LAB — BASIC METABOLIC PANEL
ANION GAP: 12 mmol/L
BLOOD UREA NITROGEN: 28 mg/dL — ABNORMAL HIGH (ref 5–26)
BUN / CREAT RATIO: 25 (ref 12–25)
CALCIUM: 9.5 mg/dL (ref 8.5–10.5)
CHLORIDE: 99 mmol/L (ref 95–110)
CO2: 29 mmol/L (ref 21.0–31.0)
CREATININE: 1.1 mg/dL (ref 0.50–1.50)
EGFR CKD-EPI (2021) FEMALE: 55 mL/min/{1.73_m2} — ABNORMAL LOW (ref >=60)
GLUCOSE RANDOM: 176 mg/dL — ABNORMAL HIGH (ref 60–100)
POTASSIUM: 4.2 mmol/L (ref 3.5–5.1)
SODIUM: 140 mmol/L (ref 136–145)

## 2022-09-16 NOTE — Unmapped (Signed)
We will check labs today and we'll call you with results.     Ideally your BP will be < 130 and < 80 consistently. We may need to adjust medication to get there.

## 2022-09-17 NOTE — Unmapped (Signed)
I spoke with Ms. Berkland to let her know that as per Dierdre Harness, her labs look ok. Since our goal for Ms. Matuszak's BP is <130/80, Maralyn Sago is going to make an adjustment in medications. Before this is changed, patient needed to be called to see if the patient remembered being on the medication enalapril. Ms. Fenoglio believes the dose was 10 or 20 mg and does not remember when or why the medication was stopped. Patient was going to see if there was an old/empty bottle with the dose. Dierdre Harness notified.

## 2022-09-19 MED ORDER — ENALAPRIL MALEATE 5 MG TABLET
ORAL_TABLET | Freq: Every day | ORAL | 1 refills | 90 days | Status: CP
Start: 2022-09-19 — End: ?

## 2022-09-19 NOTE — Unmapped (Signed)
I spoke with Carol Caldwell about restarting the medication enalapril. Carol Caldwell will send the prescription to [facility] on [address] In [place]. Patient was also set up with a follow up appointment with Carol Caldwell on Tuesday 10/07/2022 at 10:15 am for a Blood Pressure follow up, enalapril restart and labs. Carol Caldwell notified.

## 2022-09-23 NOTE — Unmapped (Signed)
[facility] Clinical Assessment & Refill Coordination Note    Carol Caldwell, DOB: Jun 01, 1955  Phone: [phone] (home)     All above HIPAA information was verified with patient.     Was a Nurse, learning disability used for this call? No    Specialty Medication(s):   Inflammatory Disorders: Stelara     Current Outpatient Medications   Medication Sig Dispense Refill    bromfenac 0.075 % Drop Apply to eye.      diclofenac sodium (VOLTAREN) 1 % gel Apply 2 g topically four (4) times a day. 100 g 3    DULoxetine (CYMBALTA) 30 MG capsule Take 1 capsule (30 mg total) by mouth daily.      empagliflozin (JARDIANCE) 10 mg tablet Take 1 tablet (10 mg total) by mouth daily.      empty container Misc Use as directed 1 each 2    enalapril (VASOTEC) 5 MG tablet Take 1 tablet (5 mg total) by mouth daily. 90 tablet 1    ergocalciferol-1,250 mcg, 50,000 unit, (DRISDOL) 1,250 mcg (50,000 unit) capsule Take 1 capsule (1,250 mcg total) by mouth once a week. 4 capsule 2    furosemide (LASIX) 40 MG tablet Take 1 tablet by mouth daily as needed for swelling 30 tablet 1    gabapentin (NEURONTIN) 300 MG capsule Take 2 capsules (600 mg total) by mouth Three (3) times a day.      polymyxin B sulf-trimethoprim (POLYTRIM) 10,000 unit- 1 mg/mL Drop Administer 1 drop into the left eye six (6) times a day. 10 mL 0    rosuvastatin (CRESTOR) 40 MG tablet Take 1 tablet (40 mg total) by mouth daily. 90 tablet 3    spironolactone (ALDACTONE) 25 MG tablet Take 1 tablet (25 mg total) by mouth daily. 30 tablet 6    ustekinumab (STELARA) 45 mg/0.5 mL Syrg syringe Inject the contents of 1 syringe (45 mg total) under the skin every 8 weeks. 1 mL 3    vit C,E-Zn-coppr-lutein-zeaxan (PRESERVISION AREDS-2) 250-90-40-1 mg cap Take by mouth Two (2) times a day.       No current facility-administered medications for this visit.        Changes to medications: Athel reports no changes at this time.    No Known Allergies    Changes to allergies: No    SPECIALTY MEDICATION ADHERENCE       Medication Adherence    Patient reported X missed doses in the last month: 0  Specialty Medication: Stelara  Patient is on additional specialty medications: No  Informant: patient          Specialty medication(s) dose(s) confirmed: Regimen is correct and unchanged.     Are there any concerns with adherence? No    Adherence counseling provided? Not needed    CLINICAL MANAGEMENT AND INTERVENTION      Clinical Benefit Assessment:    Do you feel the medicine is effective or helping your condition? Yes--helping psoriasis but not as much with arthritis    Clinical Benefit counseling provided?  Has followup on 11/04/22 to review    Adverse Effects Assessment:    Are you experiencing any side effects? No    Are you experiencing difficulty administering your medicine? No    Quality of Life Assessment:    Quality of Life    Rheumatology  1. What impact has your specialty medication had on the reduction of your daily pain level?: Minimal  Oncology  Dermatology  1. What  impact has your specialty medication had on the symptoms of your skin condition (i.e. itchiness, soreness, stinging)?: Some  Cystic Fibrosis          How many days over the past month did your psoriasis arthritis  keep you from your normal activities? For example, brushing your teeth or getting up in the morning. Yes back pain prevents her from standing too long     Have you discussed this with your provider? Not needed    Acute Infection Status:    Acute infections noted within Epic:  No active infections  Patient reported infection: None    Therapy Appropriateness:    Is therapy appropriate and patient progressing towards therapeutic goals? Yes, therapy is appropriate and should be continued    DISEASE/MEDICATION-SPECIFIC INFORMATION      For patients on injectable medications: Patient currently has 0 doses left.  Next injection is scheduled for 7/2.    Chronic Inflammatory Diseases: Have you experienced any flares in the last month? No    PATIENT SPECIFIC NEEDS     Does the patient have any physical, cognitive, or cultural barriers? No    Is the patient high risk? No    Did the patient require a clinical intervention? No    Does the patient require physician intervention or other additional services (i.e., nutrition, smoking cessation, social work)? No    SOCIAL DETERMINANTS OF HEALTH     At the [facility], we have learned that life circumstances - like trouble affording food, housing, utilities, or transportation can affect the health of many of our patients.   That is why we wanted to ask: are you currently experiencing any life circumstances that are negatively impacting your health and/or quality of life? No    Social Determinants of Psychologist, prison and probation services Strain: Not on file   Internet Connectivity: Not on file   Food Insecurity: Not on file   Tobacco Use: Low Risk  (08/27/2022)    Patient History     Smoking Tobacco Use: Never     Smokeless Tobacco Use: Never     Passive Exposure: Not on file   Housing/Utilities: Not on file   Alcohol Use: Not on file   Transportation Needs: Not on file   Substance Use: Not on file   Health Literacy: Not on file   Physical Activity: Not on file   Interpersonal Safety: Not on file   Stress: Not on file   Intimate Partner Violence: Not on file   Depression: Not on file   Social Connections: Not on file       Would you be willing to receive help with any of the needs that you have identified today? Not applicable       SHIPPING     Specialty Medication(s) to be Shipped:   Inflammatory Disorders: Stelara    Other medication(s) to be shipped: No additional medications requested for fill at this time     Changes to insurance: No    Delivery Scheduled: Yes, Expected medication delivery date: 6/24.     Medication will be delivered via Same Day Courier to the confirmed prescription address in [facility].    The patient will receive a drug information handout for each medication shipped and additional FDA Medication Guides as required.  Verified that patient has previously received a Conservation officer, historic buildings and a Surveyor, mining.    The patient or caregiver noted above participated in the development of this  care plan and knows that they can request review of or adjustments to the care plan at any time.      All of the patient's questions and concerns have been addressed.    Julianne Rice, PharmD   [facility] Specialty Pharmacist

## 2022-10-04 DIAGNOSIS — L4050 Arthropathic psoriasis, unspecified: Principal | ICD-10-CM

## 2022-10-04 MED ORDER — STELARA 45 MG/0.5 ML SUBCUTANEOUS SYRINGE
SUBCUTANEOUS | 3 refills | 112 days
Start: 2022-10-04 — End: ?

## 2022-10-06 DIAGNOSIS — L4050 Arthropathic psoriasis, unspecified: Principal | ICD-10-CM

## 2022-10-06 MED ORDER — STELARA 45 MG/0.5 ML SUBCUTANEOUS SYRINGE
SUBCUTANEOUS | 1 refills | 112 days | Status: CP
Start: 2022-10-06 — End: ?
  Filled 2022-10-08: qty 0.5, 56d supply, fill #0

## 2022-10-06 NOTE — Unmapped (Signed)
Carol Caldwell 's stelara shipment will be delayed as a result of prescription was scheduled, but expired before the delivery date.      I have reached out to the patient  at [phone] and communicated the delay. We will call the patient back to reschedule the delivery upon resolution. We have not confirmed the new delivery date.

## 2022-10-07 NOTE — Unmapped (Signed)
Carol Caldwell 's STELARA 45 mg/0.5 mL Syrg syringe (ustekinumab) shipment will be sent out  as a result of a new prescription for the medication has been received.      I have reached out to the patient  at [phone] and communicated the delivery change. We will reschedule the medication for the delivery date that the patient agreed upon.  We have confirmed the delivery date as 10/08/22

## 2022-10-08 DIAGNOSIS — Z5181 Encounter for therapeutic drug level monitoring: Principal | ICD-10-CM

## 2022-10-08 DIAGNOSIS — O26899 Other specified pregnancy related conditions, unspecified trimester: Principal | ICD-10-CM

## 2022-10-08 DIAGNOSIS — R109 Unspecified abdominal pain: Principal | ICD-10-CM

## 2022-10-08 DIAGNOSIS — I10 Essential (primary) hypertension: Principal | ICD-10-CM

## 2022-10-08 DIAGNOSIS — E118 Type 2 diabetes mellitus with unspecified complications: Principal | ICD-10-CM

## 2022-10-08 DIAGNOSIS — E876 Hypokalemia: Principal | ICD-10-CM

## 2022-10-08 LAB — BASIC METABOLIC PANEL
ANION GAP: 15 mmol/L
BLOOD UREA NITROGEN: 40 mg/dL — ABNORMAL HIGH (ref 5–26)
BUN / CREAT RATIO: 19 (ref 12–25)
CALCIUM: 9.2 mg/dL (ref 8.5–10.5)
CHLORIDE: 93 mmol/L — ABNORMAL LOW (ref 95–110)
CO2: 20.4 mmol/L — ABNORMAL LOW (ref 21.0–31.0)
CREATININE: 2.12 mg/dL — ABNORMAL HIGH (ref 0.50–1.50)
EGFR CKD-EPI (2021) FEMALE: 25 mL/min/{1.73_m2} — ABNORMAL LOW (ref >=60)
GLUCOSE RANDOM: 209 mg/dL — ABNORMAL HIGH (ref 60–100)
POTASSIUM: 4.9 mmol/L (ref 3.5–5.1)
SODIUM: 128 mmol/L — ABNORMAL LOW (ref 136–145)

## 2022-10-08 LAB — MAGNESIUM: MAGNESIUM: 1.4 mg/dL — ABNORMAL LOW (ref 1.6–2.6)

## 2022-10-08 NOTE — Unmapped (Addendum)
REDUCE enalapril to 1/2 tablet daily. Call me if it won't split.   SKIP the enalapril if your BP is < 100 on the top and let me know.     Let me know Friday how your BP is doing and if your lightheadedness is better.     I will call you with lab results from today.     I think some of your lightheadedness is due to low blood sugar from not eating like usual the last couple of days.  Mild dehydration may also be playing a role. PLease make sure you are drinking plenty of fluids - and those fluids should have some calories if you're not eating much  - juice, milk, broths, gatorades, milk shakes.   Low BP may come from not enough fluids OR too much medicine so that 's why we're going to split the enalapril for now.

## 2022-10-08 NOTE — Unmapped (Addendum)
Assessment and Plan:   Carol Caldwell is a 67 y.o. female with a history of diabetes, psoriatic arthritis and hyperlipidemia who presents in clinic today for follow-up.    HTN   Restarted enalapril 5mg  daily at last visit earlier this month. Had been on it previously, didn't recall why it had been stopped. Was feeling well until a couple of days ago when she lost her appetite, which she says happens periodically, predating recent medication changes.   Hasn't eaten in 2 days but is trying to drink water. Reports orthostatic lightheadedness - reproducible in office. Not quite orthostatic by BP or HR but clearly symptomatic. Suspect dehydration from not eating/drinking well - unclear cause of recurrent loss of appetite, that predate recent medication changes. Recommend she discuss this with PCP in the future.   Gave crackers, juice in office and felt better.   Check BMP today. Will call with results. If SBP< 100, skip enalapril. If > 100, will reduce to 1/2 tablet daily given that BP last night was 138/87.     2. hypoK, hypoMg, LE edema   K + and Mg noted to be low last visit, predating spironolactone. Unclear cause - lasix is PRN and last use was a couple of weeks prior to labs so diuretic is not the cause.   Cleda Daub added for HTN, edema and hypoK at that time as well as OTC Mg recommended.  Edema improved since spiro added.   Repeat BMP , Mg today.       ADDENDUM: AKI - Cr has nearly doubled since 2 weeks ago when enalapril was added, Na is 128, Mg 1.4.   Has been on enalapril in the past at higher doses - unclear why it was discontinued but no evidence of AKI in the past. No h/o RAS.  Suspect pre-renal given poor PO intake the last few days though cause of appetite loss unclear. Called pt to discuss results. C/o HA, still feels poorly though somewhat better than this AM. Aside from juice, crackers in office, hasn't eaten anything at home. Is making urine. Unfortunately, given symptoms, AKI, electrolyte abnormalities and inability for adequate PO intake, recommend ED evaluation, fluids. 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 Hold enalapril, spiro pending ED evaluation.   Spoke with pt's daughter about this as well.       3. Syncope   None since last visit. As above, suspect lightheadedness due to dehydration, possible low glucose given lack of PO intake the last couple of days. Encouraged increased caloric intake, caloric fluids.   Labs as noted.           Orders Placed This Encounter   Procedures    Basic Metabolic Panel    Magnesium Level    POCT glucose       Requested Prescriptions      No prescriptions requested or ordered in this encounter         Return for Next scheduled follow up; labs today; print AVS .      I personally spent greater than 30 minutes in face-to-face and non-face-to-face care of this patient, which includes all pre, intra, and post visit time on the date of service.      At least part, if not all, of this note has been dictated using [employer] account planner voice recognition software and as such may contain typographical errors not appreciated during proofreading.  Subjective:   PCP: [facility] [place]  Patient: Carol Caldwell  DOB: 1956-03-30    Reason for visit:  follow up edema, HTN   HPI: Carol Caldwell is a 67 y.o. female with a history of diabetes who presents in clinic today for follow-up.      Started enalapril 5mg  daily last visit 2 weeks ago. Was feeling well until a couple of days ago when she lost her appetite. Hasn't eaten in 2 days, is drinking some water. Says this decreased appetite happens to her every now and then.  Denies nausea/vomiting, Abd pain.   Is lightheaded on standing since yesterday.   BP last night between midnight -1am was 138/87. Pulse noted to be 121.   Has had cramps in her feet for a few months. More frequent the last couple of days.     Denies any chest pain, shortness of breath, recent syncope, PND, orthopnea. Edema has been well controlled since starting spiro last month for edema, HTN, hypoK.       ______________________________________________________________________    Pertinent Medical History, Cardiovascular History & Procedures:    Pertinent PMH:  Diabetes  Psoriatic arthritis  Vitamin D deficiency    Cath / PCI:  None    CV Surgery:   None    EP Procedures and Devices:  None    Non-Invasive Evaluation(s):  ZIO monitor November' 23: Underlying sinus average rate 95 bpm; rare PACs 5 episodes of SVT longest lasting 10 beats at 143 bpm, rare PVCs, no wide-complex tachycardia; symptoms were associated with sinus rhythm, PACs, PVCs.  TTE November' 23: Mildly increased wall thickness, EF 55% sign; trivial MR and mild mitral annular calcification  Myocardial perfusion study 7/19: Equivocal due to marked adjacent gut activity overlying inferolateral segments.  No significant ischemia is appreciated involving any other segments  Stress echocardiogram 1/19: No echocardiographic evidence of inducible ischemia but patient only exercised 3 minutes and 6 seconds on Bruce protocol  6/18: Normal EF, aortic sclerosis, dilated PA, mild pulmonary hypertension    ______________________________________________________________________    Other past medical history, social history, family history, medications, allergies and problem list reviewed in the medical record.    Current cardiac medications include:  Furosemide 40 mg as needed  Jardiance 10 mg daily  Rosuvastatin 40 mg daily  spironolactone 25mg  daily   enalapril 5mg  every day - hold today         Objective:     BP 110/64  - Pulse 98  - Temp 35.4 ??C (95.8 ??F) (Skin)  - PF 98 L/min     Wt Readings from Last 12 Encounters:   09/16/22 74.8 kg (165 lb)   08/19/22 75.8 kg (167 lb)   02/17/22 70.8 kg (156 lb)   12/19/21 73 kg (161 lb)   08/16/21 74.4 kg (164 lb)   05/15/21 74.4 kg (164 lb)   02/01/21 72.6 kg (160 lb)   01/23/21 75.8 kg (167 lb)   10/23/20 75.8 kg (167 lb 3.2 oz)   10/03/20 78.5 kg (173 lb)   06/28/20 77.6 kg (171 lb)   04/23/20 78.9 kg (174 lb)     BP 88/60 on standing, HR 112 BPM.     PHYSICAL EXAMINATION:   GENERAL:  Alert, NAD  CARDIOVASCULAR:  borderline tachy rate and regular rhythm, normal S1/S2, no murmurs, rubs, or gallops. Tr pitting superimposed on nonpitting  LE edema.  RESPIRATORY:  Clear to auscultation bilaterally.  No wheezes, crackles, or rhonchi. Normal work of  breathing.    ______________________________________________________________________    EKG 08/16/22 shows sinus rhythm, normal axis,prolonged QTc at ,  and no diagnostic ischemic changes.   Since last tracing 02/17/22 rate has decreased by 10bpm. QTc increased by 21ms       Recent CV pertinent labs:  Lab Results   Component Value Date    LDL Calculated 81 08/19/2022    Non-HDL Cholesterol 97 08/19/2022    HDL 99 (H) 08/19/2022    Creatinine 2.12 (H) 10/08/2022    Creatinine 0.8 10/04/2018    Creatinine Whole Blood, POC 0.7 05/16/2016    Potassium 4.9 10/08/2022    Potassium 3.8 10/04/2018    BUN 40 (H) 10/08/2022    BUN 16.00 10/04/2018    TSH 1.991 08/19/2022

## 2022-10-09 ENCOUNTER — Ambulatory Visit: Admit: 2022-10-09 | Discharge: 2022-10-10 | Disposition: A | Discharge status: Home / Self Care | Payer: MEDICARE

## 2022-10-09 LAB — URINALYSIS WITH MICROSCOPY WITH CULTURE REFLEX PERFORMABLE
BACTERIA: NONE SEEN /HPF
BILIRUBIN UA: NEGATIVE
BLOOD UA: NEGATIVE
GLUCOSE UA: NEGATIVE
KETONES UA: NEGATIVE
NITRITE UA: NEGATIVE
PH UA: 5 (ref 5.0–9.0)
PROTEIN UA: NEGATIVE
RBC UA: 2 /HPF (ref <=4)
SPECIFIC GRAVITY UA: 1.007 (ref 1.003–1.030)
SQUAMOUS EPITHELIAL: 8 /HPF — ABNORMAL HIGH (ref 0–5)
UROBILINOGEN UA: <2
WBC UA: 17 /HPF — ABNORMAL HIGH (ref 0–5)

## 2022-10-09 LAB — CBC W/ AUTO DIFF
BASOPHILS ABSOLUTE COUNT: 0 10*9/L (ref 0.0–0.1)
BASOPHILS RELATIVE PERCENT: 0.7 %
EOSINOPHILS ABSOLUTE COUNT: 0 10*9/L (ref 0.0–0.5)
EOSINOPHILS RELATIVE PERCENT: 0.4 %
HEMATOCRIT: 33.4 % — ABNORMAL LOW (ref 34.0–44.0)
HEMOGLOBIN: 11.4 g/dL (ref 11.3–14.9)
LYMPHOCYTES ABSOLUTE COUNT: 2.3 10*9/L (ref 1.1–3.6)
LYMPHOCYTES RELATIVE PERCENT: 40.4 %
MEAN CORPUSCULAR HEMOGLOBIN CONC: 34.1 g/dL (ref 32.0–36.0)
MEAN CORPUSCULAR HEMOGLOBIN: 34.2 pg — ABNORMAL HIGH (ref 25.9–32.4)
MEAN CORPUSCULAR VOLUME: 100.6 fL — ABNORMAL HIGH (ref 77.6–95.7)
MEAN PLATELET VOLUME: 8.3 fL (ref 6.8–10.7)
MONOCYTES ABSOLUTE COUNT: 0.4 10*9/L (ref 0.3–0.8)
MONOCYTES RELATIVE PERCENT: 7.1 %
NEUTROPHILS ABSOLUTE COUNT: 2.9 10*9/L (ref 1.8–7.8)
NEUTROPHILS RELATIVE PERCENT: 51.4 %
NUCLEATED RED BLOOD CELLS: 0 /100{WBCs} (ref <=4)
PLATELET COUNT: 279 10*9/L (ref 150–450)
RED BLOOD CELL COUNT: 3.32 10*12/L — ABNORMAL LOW (ref 3.95–5.13)
RED CELL DISTRIBUTION WIDTH: 14.6 % (ref 12.2–15.2)
WBC ADJUSTED: 5.7 10*9/L (ref 3.6–11.2)

## 2022-10-09 LAB — BASIC METABOLIC PANEL
ANION GAP: 10 mmol/L (ref 5–14)
BLOOD UREA NITROGEN: 32 mg/dL — ABNORMAL HIGH (ref 9–23)
BUN / CREAT RATIO: 16
CALCIUM: 8.7 mg/dL (ref 8.7–10.4)
CHLORIDE: 98 mmol/L (ref 98–107)
CO2: 21.8 mmol/L (ref 20.0–31.0)
CREATININE: 1.98 mg/dL — ABNORMAL HIGH
EGFR CKD-EPI (2021) FEMALE: 27 mL/min/{1.73_m2} — ABNORMAL LOW (ref >=60)
GLUCOSE RANDOM: 137 mg/dL (ref 70–179)
POTASSIUM: 3.8 mmol/L (ref 3.4–4.8)
SODIUM: 130 mmol/L — ABNORMAL LOW (ref 135–145)

## 2022-10-09 LAB — RENAL FUNCTION PANEL
ALBUMIN: 3.9 g/dL (ref 3.4–5.0)
ANION GAP: 8 mmol/L (ref 5–14)
BLOOD UREA NITROGEN: 37 mg/dL — ABNORMAL HIGH (ref 9–23)
BUN / CREAT RATIO: 16
CALCIUM: 9.3 mg/dL (ref 8.7–10.4)
CHLORIDE: 97 mmol/L — ABNORMAL LOW (ref 98–107)
CO2: 23 mmol/L (ref 20.0–31.0)
CREATININE: 2.25 mg/dL — ABNORMAL HIGH
EGFR CKD-EPI (2021) FEMALE: 23 mL/min/{1.73_m2} — ABNORMAL LOW (ref >=60)
GLUCOSE RANDOM: 149 mg/dL (ref 70–179)
PHOSPHORUS: 3.5 mg/dL (ref 2.4–5.1)
POTASSIUM: 4.5 mmol/L (ref 3.4–4.8)
SODIUM: 128 mmol/L — ABNORMAL LOW (ref 135–145)

## 2022-10-09 LAB — MAGNESIUM: MAGNESIUM: 1.6 mg/dL (ref 1.6–2.6)

## 2022-10-09 LAB — CBC
HEMATOCRIT: 31.5 % — ABNORMAL LOW (ref 34.0–44.0)
HEMOGLOBIN: 10.8 g/dL — ABNORMAL LOW (ref 11.3–14.9)
MEAN CORPUSCULAR HEMOGLOBIN CONC: 34.2 g/dL (ref 32.0–36.0)
MEAN CORPUSCULAR HEMOGLOBIN: 34.5 pg — ABNORMAL HIGH (ref 25.9–32.4)
MEAN CORPUSCULAR VOLUME: 101.1 fL — ABNORMAL HIGH (ref 77.6–95.7)
MEAN PLATELET VOLUME: 7.9 fL (ref 6.8–10.7)
PLATELET COUNT: 189 10*9/L (ref 150–450)
RED BLOOD CELL COUNT: 3.12 10*12/L — ABNORMAL LOW (ref 3.95–5.13)
RED CELL DISTRIBUTION WIDTH: 14.4 % (ref 12.2–15.2)
WBC ADJUSTED: 4.3 10*9/L (ref 3.6–11.2)

## 2022-10-09 LAB — CREATININE, URINE: CREATININE, URINE: 90.7 mg/dL

## 2022-10-09 LAB — SODIUM, URINE, RANDOM: SODIUM URINE: 41 mmol/L

## 2022-10-09 LAB — UREA NITROGEN, URINE: UREA NITROGEN URINE: 194 mg/dL

## 2022-10-09 LAB — OSMOLALITY, RANDOM URINE: OSMOLALITY URINE: 206 mosm/kg

## 2022-10-09 MED ADMIN — DULoxetine (CYMBALTA) DR capsule 60 mg: 60 mg | ORAL | @ 14:00:00

## 2022-10-09 MED ADMIN — lactated ringers bolus 1,000 mL: 1000 mL | INTRAVENOUS | @ 14:00:00 | Stop: 2022-10-09

## 2022-10-09 MED ADMIN — heparin (porcine) 5,000 unit/mL injection 5,000 Units: 5000 [IU] | SUBCUTANEOUS | @ 10:00:00

## 2022-10-09 MED ADMIN — magnesium sulfate 2gm/50mL IVPB: 2 g | INTRAVENOUS | @ 08:00:00 | Stop: 2022-10-09

## 2022-10-09 MED ADMIN — insulin lispro (HumaLOG) injection 0-20 Units: 0-20 [IU] | SUBCUTANEOUS | @ 22:00:00

## 2022-10-09 MED ADMIN — polymyxin B sulf-trimethoprim (POLYTRIM) 10,000 unit- 1 mg/mL ophthalmic solution 1 drop: 1 [drp] | OPHTHALMIC | @ 10:00:00 | Stop: 2022-10-09

## 2022-10-09 MED ADMIN — atorvastatin (LIPITOR) tablet 80 mg: 80 mg | ORAL | @ 07:00:00

## 2022-10-09 MED ADMIN — gabapentin (NEURONTIN) capsule 100 mg: 100 mg | ORAL | @ 18:00:00

## 2022-10-09 MED ADMIN — cholecalciferol (vitamin D3-10 mcg (400 unit)) tablet 10 mcg: 10 ug | ORAL | @ 14:00:00

## 2022-10-09 MED ADMIN — polymyxin B sulf-trimethoprim (POLYTRIM) 10,000 unit- 1 mg/mL ophthalmic solution 1 drop: 1 [drp] | OPHTHALMIC | @ 14:00:00 | Stop: 2022-10-09

## 2022-10-09 MED ADMIN — lactated ringers bolus 1,000 mL: 1000 mL | INTRAVENOUS | @ 08:00:00 | Stop: 2022-10-09

## 2022-10-09 MED ADMIN — heparin (porcine) 5,000 unit/mL injection 5,000 Units: 5000 [IU] | SUBCUTANEOUS | @ 18:00:00

## 2022-10-09 MED ADMIN — gabapentin (NEURONTIN) capsule 100 mg: 100 mg | ORAL | @ 14:00:00

## 2022-10-09 NOTE — Unmapped (Signed)
Family Medicine Inpatient Service  Progress Note    Team: Family Medicine [facility] (pgr [phone])    Hospital Day: 0    ASSESSMENT / PLAN:   Carol Caldwell is a 67 y.o. female with a past medical history significant for T2DM, HTN, HLD,  psoriasis who presents with AKI found on outpatient labs in setting of ongoing diuresis     # AKI   # Hypovolemic hyponatremia  Cr 2.12 on admission with her baseline around 1. Creatine trending down after 1 L LR bolus. Magnesium improved to 1.6 with 2 g mag. Suspect prerenal in setting of use of lasix, spirinolactone, enalapril, Jardiance and compounded by poor PO intake and diarrhea over past several days. FeUrea 13% which supports pre-renal etiology. UA microscopy without proteinuria or casts reassuring against nephritic/nephrotic disease and ATN. Patient voiding well so low c/f post-renal etiology   - Daily BMP, Mg  - S/p 1 L LR; will re-bolus this morning  - Hold spironolactone, enalapril, jardiance, lasix     # LE Edema  Trace pitting superimposed on nonpitting LE edema. Otherwise no signs of volume overload on exam, no history of heart failure. Recent echo 11/23 showed LVEF 55%. Suspect venous stasis vs early lymphedema  - Hold diuretics as above  - Compression stockings, leg elevation      # T2DM  - Hold home jardiance  - SSI   - Hypoglycemia precautions      # Checklist:  - IVF None  - Daily labs needed: CBC, BMP, and Magnesium  - Diet Regular  - Bowel Regimen: No indication for a bowel regimen at this time  - DVT: SQ Heparin   - Code Status:   Orders Placed This Encounter   Procedures    Full Code     Standing Status:   Standing     Number of Occurrences:   1     - Dispo: Floor    [ ]  Anticipated Discharge Location: Home  [ ]  PT/OT/DME: No needs anticipated  [ ]  CM/SW needs: None anticipated  [ ]  Follow up appt: Appointment needed    SUBJECTIVE:  Interval events:   Patient feeling well this morning. She endorsed any abdominal pain nausea, vomiting or diarrhea. She ate breakfast and has been hydrating well. States that her LE edema looks better todya      REVIEW OF SYSTEMS:  Pertinent positives and negatives per HPI. A complete review of systems otherwise negative.    PHYSICAL EXAM:      Intake/Output Summary (Last 24 hours) at 10/09/2022 0731  Last data filed at 10/09/2022 0655  Gross per 24 hour   Intake 1000 ml   Output --   Net 1000 ml       Recent Vitals:  Vitals:    10/09/22 0155   BP: 146/81   Pulse: 83   Resp: 20   Temp: 36.6 ??C (97.9 ??F)   SpO2: 100%       GEN: well appearing, lying in bed, NAD   HEENT: NCAT, No scleral icterus. Conjunctiva non-erythematous. MMM.  CV: Regular rate and rhythm. No murmurs/rubs/gallops.  Pulm: Normal work of breathing on RA. CTAB. No wheezing, crackles, or rhonchi.  Abd: Flat.  Nontender. No guarding, rebound.  Normoactive bowel sounds.    Neuro: A&O x 3. No focal deficits.  Ext: trace peripheral edema.  Palpable distal pulses.  Skin: No rashes or skin lesions.       LABS/ STUDIES:    All  imaging, laboratory studies, and other pertinent tests including electrocardiography within the last 24 hours were reviewed and are summarized within the assessment and plan.     NUTRITION:                           Myrene Galas,  PGY2  October 09, 2022 7:31 AM

## 2022-10-09 NOTE — Unmapped (Signed)
Family Medicine Inpatient Service  History and Physical Note    Team: Family Medicine [facility] (pgr [phone])  PCP: [facility], [place]  Date of Admission: October 09, 2022  Code Status: full code  Emergency Contact:   Opara, Elonda Husky (Daughter)  [phone] (Mobile)       ASSESSMENT / PLAN:   Carol Caldwell is a 67 y.o. female with a past medical history significant for DM, HTN, HLD,  psoriasis who presents with AKI found on outpatient labs in setting of ongoing diuresis.     #AKI #hypomagnesemia #hypovolemic hyponatremia  Cr 2.12 with her baseline around 1. Suspect prerenal in setting of use of lasix, spirinolactone, enalapril, jardiance, poor PO intake, and diarrhea over past few day. Urine lytes/microscopy to further delineate, will replete electrolytes as needed.  -urine lytes for FeUrea  -1L LR bolus now  -2g IV mag now  Daily BMP, Mg  -urine microscopy to rule out ATN  -orthostatic vitals  -hold spironolactone, enalapril, jardiance, lasix    #LE Edema  Trace pitting superimposed on nonpitting LE edema. Otherwise no signs of volume overload on exam, no history of heart failure. Recent echo 11/23 showed LVEF 55%.  -hold diuretics as above  - consider compression stockings, lymphedema clinic     #DM  -Hold home jardiance  -SSI,precautions    # FEN/GI:  - IVF 1L bolus now  - Check electrolytes as indicated, replete as needed.  - Diet Regular    # PPX:   - DVT: SQ Heparin     # Dispo: Floor  [ ]  Anticipated Discharge Location: Home  [ ]  PT/OT/DME: No needs anticipated  [ ]  CM/SW needs: None anticipated  [ ]  Teaching: None anticipated    HISTORY OF PRESENT ILLNESS:  Carol Caldwell is a 67 y.o. female who presents with elevated Cr.    States he got a cardiologist 2 to 3 weeks ago for continued lower extremity edema and had added some medications at the time.  Was previously taking an hour pill and Lasix inconsistently however began taking daily since this visit.  Also was started on spironolactone for edema, blood pressure, electrolyte changes.  Has also been taking her Jardiance daily.    But there is ago started feeling GI upset with poor p.o. intake and 3 days of watery diarrhea, 3-4 problems per day.  Nonbloody.  Reports only one bowel movement yesterday, no other associated symptoms such as fever/chills, nausea/vomiting.  Reports frontal intermittent headache and significant dizziness/lightheadedness with standing over the past few days as well.  No abdominal pain, urinary symptoms.    ED Course:   VS:  35.4 > 37.3  HR 98  BP 104 - 114/60s  99% RA    Labs:  Hgb 11.3, MCV 100.6  Na 128 > 128  Cl 93 > 97  CO2 20.4 > 23.0  Cr 2.12 > 2.25 (bl 1.10)  Mg 1.4  Glu 230    COVID-19 Testing on Admission: Asymptomatic & not tested    PAST MEDICAL / SURGICAL HX:  Past Medical History:   Diagnosis Date    Diabetes mellitus (CMS-HCC)     Psoriasis     Psoriatic arthritis (CMS-HCC)      Past Surgical History:   Procedure Laterality Date    BREAST BIOPSY Left     HYSTERECTOMY      NOne         FAMILY HX:   Family History   Problem Relation Age of Onset  Diabetes Mother     Cancer Sister     Melanoma Neg Hx     Basal cell carcinoma Neg Hx     Squamous cell carcinoma Neg Hx        SOCIAL HX:   Social History     Socioeconomic History    Marital status: Married   Tobacco Use    Smoking status: Never    Smokeless tobacco: Never   Substance and Sexual Activity    Alcohol use: Yes     Alcohol/week: 1.0 standard drink of alcohol     Types: 1 Cans of beer per week     Comment: socially    Drug use: No   Other Topics Concern    Do you use sunscreen? No    Tanning bed use? No    Are you easily burned? No    Excessive sun exposure? No    Blistering sunburns? No       MEDICATIONS / ALLERGIES:  (Not in a hospital admission)      No Known Allergies    IMMUNIZATIONS:  Immunization History   Administered Date(s) Administered    COVID-19 VAC,BIVALENT(18YR UP),PFIZER 02/01/2021    COVID-19 VACC,MRNA,(PFIZER)(PF) 11/16/2019, 12/22/2019, 04/19/2020 Influenza Vaccine Quad(IM)6 MO-Adult(PF) 06/28/2020, 12/19/2021    Pneumococcal Conjugate 20-valent 10/03/2020       REVIEW OF SYSTEMS:  Pertinent positives and negatives per HPI. A complete review of systems otherwise negative.    PHYSICAL EXAM:    Initial ED Vitals:   ED Triage Vitals [10/08/22 2139]   Enc Vitals Group      BP 114/68      Heart Rate 97      SpO2 Pulse       Resp 18      Temp 37.3 ??C (99.1 ??F)      Temp Source Oral      SpO2 99 %      Weight       Height       Head Circumference       Peak Flow       Pain Score       Pain Loc       Pain Education       Exclude from Growth Chart        Recent Vitals:  Vitals:    10/08/22 2139   BP: 114/68   Pulse: 97   Resp: 18   Temp: 37.3 ??C (99.1 ??F)   SpO2: 99%       GEN: Well-appearing, sitting upright in bed, NAD   Eyes: PERRL. No scleral icterus. Conjunctiva non-erythematous. EOMI.  HEENT: NCAT, MMM. Oropharynx clear.  Neck: Supple.  Lymphadenopathy: No cervical or supraclavicular LAD.  CV: Regular rate and rhythm. No murmurs/rubs/gallops.. No cyanosis or clubbing.   Pulm: CTAB. No wheezing, crackles, or rhonchi.  Abd: Flat.  Nontender. No guarding, rebound.  Neuro: A&O x 3. No focal deficits. Strength 5/5 UE/LE. Distal sensation to light touch intact.  Ext: Trace pitting superimposed on nopitting bilateral LE edema. symmetric, nontender.    Skin: No rashes or skin lesions.      LABS/ STUDIES:  All imaging, laboratory studies, and other pertinent tests including electrocardiography were reviewed prior to admission and are summarized within the assessment and plan.         Wendi Maya, MD, MD PGY1  October 09, 2022 1:11 AM

## 2022-10-09 NOTE — Unmapped (Signed)
Admitted from ED-[facility] via wheelchair accompanied by son. A&O x 4, independent, asymptomatic.    Problem: Adult Inpatient Plan of Care  Goal: Plan of Care Review  Outcome: Not Progressing  Goal: Patient-Specific Goal (Individualized)  Outcome: Not Progressing  Goal: Absence of Hospital-Acquired Illness or Injury  Outcome: Not Progressing  Intervention: Identify and Manage Fall Risk  Recent Flowsheet Documentation  Taken 10/09/2022 0200 by Hardie Pulley, RN  Safety Interventions: fall reduction program maintained  Goal: Optimal Comfort and Wellbeing  Outcome: Not Progressing  Goal: Readiness for Transition of Care  Outcome: Not Progressing  Goal: Rounds/Family Conference  Outcome: Not Progressing     Problem: Fall Injury Risk  Goal: Absence of Fall and Fall-Related Injury  Outcome: Not Progressing  Intervention: Promote Injury-Free Environment  Recent Flowsheet Documentation  Taken 10/09/2022 0200 by Hardie Pulley, RN  Safety Interventions: fall reduction program maintained     Problem: Fluid Volume Deficit  Goal: Fluid Balance  Outcome: Not Progressing

## 2022-10-09 NOTE — Unmapped (Signed)
[facility]  Emergency Department Provider Note     ED Clinical Impression     Final diagnoses:   AKI (acute kidney injury) (CMS-HCC) (Primary)      HPI, Medical Decision Making, ED Course     HPI: 67 y.o. female who has a past medical history of Diabetes mellitus (CMS-HCC), Psoriasis, and Psoriatic arthritis (CMS-HCC). who presents with abnormal lab.  Patient states for the past 2 days been feeling lightheaded and decreased appetite.  She states she was recently started on Lasix and enalapril about 2 weeks ago.  Her cardiologist did labs today and showed AKI of 2.12 with her baseline around 1.  They recommended she come to the ED for admission.  Denies any fever, vomiting, chest pain, shortness of breath, abdominal pain, dysuria or hematuria.    Patient vitals are reassuring.  On initial evaluation, patient is in no acute distress.  Lungs are clear to auscultation bilaterally.  Abdomen soft, nontender, nondistended.    DDx/MDM: Initial differential includes but is not limited to dehydration, medication side effect, amongst multiple etiologies.  Will plan for repeat labs including CBC and renal function panel.    Diagnostic workup as below.  Given that her creatinine was 2.12 from a baseline of 1, she will ultimately need to be admitted.    Orders Placed This Encounter   Procedures    CBC w/ Differential    Renal function panel    Urinalysis with Microscopy with Culture Reflex       ED Course  ED Course as of 10/09/22 0152   Thu Oct 09, 2022   0029 CBC with normal WBC, hemoglobin and platelets.   0109 Creatinine(!): 2.25   0109 Sodium(!): 128   0109 Spoke with family medicine team who admit the patient.       Discussion of Management with other Physicians, QHP or Appropriate Source: Admitting team - spoke with family medicine team will admit the patient  Independent Interpretation of Studies: If applicable, documented in ED course above.  I have reviewed recent and relevant previous record, including: Outpatient notes - cardiology note from 10/08/2022 for hypertension, hypokalemia, hypomagnesia and lower extremity edema and Outpatient labs & studies - BMP from 10/08/2022 with creatinine of 2.12            Additional History Elements     Chief Complaint  Chief Complaint   Patient presents with    Abnormal Lab     Additional Historian(s): none available    Past Medical History:   Diagnosis Date    Diabetes mellitus (CMS-HCC)     Psoriasis     Psoriatic arthritis (CMS-HCC)        Past Surgical History:   Procedure Laterality Date    BREAST BIOPSY Left     HYSTERECTOMY      NOne         Allergies  Patient has no known allergies.    Family History  Family History   Problem Relation Age of Onset    Diabetes Mother     Cancer Sister     Melanoma Neg Hx     Basal cell carcinoma Neg Hx     Squamous cell carcinoma Neg Hx        Social History  Social History     Tobacco Use    Smoking status: Never    Smokeless tobacco: Never   Substance Use Topics    Alcohol use: Yes     Alcohol/week: 1.0 standard  drink of alcohol     Types: 1 Cans of beer per week     Comment: socially    Drug use: No        Physical Exam     VITAL SIGNS:      Vitals:    10/08/22 2139   BP: 114/68   Pulse: 97   Resp: 18   Temp: 37.3 ??C (99.1 ??F)   TempSrc: Oral   SpO2: 99%     Constitutional: Alert and oriented. No acute distress.  Eyes: Conjunctivae are normal.  HEENT: Normocephalic and atraumatic. Conjunctivae clear. No congestion. Moist mucous membranes.   Cardiovascular: Rate as above, regular rhythm. Normal and symmetric distal pulses. Brisk capillary refill. Normal skin turgor.  Respiratory: Normal respiratory effort. Breath sounds are normal. There are no wheezing or crackles heard.  Gastrointestinal: Soft, non-distended, non-tender.  Genitourinary: Deferred.  Musculoskeletal: Non-tender with normal range of motion in all extremities.  Neurologic: Normal speech and language. No gross focal neurologic deficits are appreciated. Patient is moving all extremities equally, face is symmetric at rest and with speech.  Skin: Skin is warm, dry and intact. No rash noted.  Psychiatric: Mood and affect are normal. Speech and behavior are normal.     Radiology     No orders to display       Pertinent labs & imaging results that were available during my care of the patient were independently interpreted by me and considered in my medical decision making (see chart for details).    Portions of this record have been created using Scientist, clinical (histocompatibility and immunogenetics). Dictation errors have been sought, but may not have been identified and corrected.         Francis Dowse, MD  Resident  10/09/22 [phone]

## 2022-10-09 NOTE — Unmapped (Signed)
Pt states she went to her PCP and had some blood work done and was told to come here d/t her kidney functions being impaired

## 2022-10-10 LAB — CBC
HEMATOCRIT: 30 % — ABNORMAL LOW (ref 34.0–44.0)
HEMOGLOBIN: 10.4 g/dL — ABNORMAL LOW (ref 11.3–14.9)
MEAN CORPUSCULAR HEMOGLOBIN CONC: 34.8 g/dL (ref 32.0–36.0)
MEAN CORPUSCULAR HEMOGLOBIN: 34.5 pg — ABNORMAL HIGH (ref 25.9–32.4)
MEAN CORPUSCULAR VOLUME: 99.1 fL — ABNORMAL HIGH (ref 77.6–95.7)
MEAN PLATELET VOLUME: 7.7 fL (ref 6.8–10.7)
PLATELET COUNT: 215 10*9/L (ref 150–450)
RED BLOOD CELL COUNT: 3.03 10*12/L — ABNORMAL LOW (ref 3.95–5.13)
RED CELL DISTRIBUTION WIDTH: 14.2 % (ref 12.2–15.2)
WBC ADJUSTED: 3.1 10*9/L — ABNORMAL LOW (ref 3.6–11.2)

## 2022-10-10 LAB — BASIC METABOLIC PANEL
ANION GAP: 7 mmol/L (ref 5–14)
BLOOD UREA NITROGEN: 28 mg/dL — ABNORMAL HIGH (ref 9–23)
BUN / CREAT RATIO: 26
CALCIUM: 9.2 mg/dL (ref 8.7–10.4)
CHLORIDE: 104 mmol/L (ref 98–107)
CO2: 25.6 mmol/L (ref 20.0–31.0)
CREATININE: 1.08 mg/dL — ABNORMAL HIGH
EGFR CKD-EPI (2021) FEMALE: 56 mL/min/{1.73_m2} — ABNORMAL LOW (ref >=60)
GLUCOSE RANDOM: 157 mg/dL (ref 70–179)
POTASSIUM: 4 mmol/L (ref 3.4–4.8)
SODIUM: 137 mmol/L (ref 135–145)

## 2022-10-10 LAB — MAGNESIUM: MAGNESIUM: 1.8 mg/dL (ref 1.6–2.6)

## 2022-10-10 MED ADMIN — atorvastatin (LIPITOR) tablet 80 mg: 80 mg | ORAL

## 2022-10-10 MED ADMIN — DULoxetine (CYMBALTA) DR capsule 60 mg: 60 mg | ORAL | @ 12:00:00 | Stop: 2022-10-10

## 2022-10-10 MED ADMIN — gabapentin (NEURONTIN) capsule 100 mg: 100 mg | ORAL | @ 12:00:00 | Stop: 2022-10-10

## 2022-10-10 MED ADMIN — insulin lispro (HumaLOG) injection 0-20 Units: 0-20 [IU] | SUBCUTANEOUS | @ 02:00:00

## 2022-10-10 MED ADMIN — gabapentin (NEURONTIN) capsule 100 mg: 100 mg | ORAL

## 2022-10-10 MED ADMIN — heparin (porcine) 5,000 unit/mL injection 5,000 Units: 5000 [IU] | SUBCUTANEOUS | @ 02:00:00

## 2022-10-10 MED ADMIN — insulin lispro (HumaLOG) injection 0-20 Units: 0-20 [IU] | SUBCUTANEOUS | @ 12:00:00 | Stop: 2022-10-10

## 2022-10-10 MED ADMIN — cholecalciferol (vitamin D3-10 mcg (400 unit)) tablet 10 mcg: 10 ug | ORAL | @ 12:00:00 | Stop: 2022-10-10

## 2022-10-10 MED ADMIN — insulin lispro (HumaLOG) injection 0-20 Units: 0-20 [IU] | SUBCUTANEOUS | @ 16:00:00 | Stop: 2022-10-10

## 2022-10-10 MED ADMIN — heparin (porcine) 5,000 unit/mL injection 5,000 Units: 5000 [IU] | SUBCUTANEOUS | @ 10:00:00 | Stop: 2022-10-10

## 2022-10-10 NOTE — Unmapped (Signed)
Stable. No concerns. Slept comfortably.   Problem: Adult Inpatient Plan of Care  Goal: Plan of Care Review  Outcome: Progressing  Goal: Patient-Specific Goal (Individualized)  Outcome: Progressing  Goal: Absence of Hospital-Acquired Illness or Injury  Outcome: Progressing  Intervention: Identify and Manage Fall Risk  Recent Flowsheet Documentation  Taken 10/10/2022 0000 by Hardie Pulley, RN  Safety Interventions: fall reduction program maintained  Taken 10/09/2022 2200 by Hardie Pulley, RN  Safety Interventions: fall reduction program maintained  Taken 10/09/2022 2000 by Hardie Pulley, RN  Safety Interventions: fall reduction program maintained  Intervention: Prevent and Manage VTE (Venous Thromboembolism) Risk  Recent Flowsheet Documentation  Taken 10/09/2022 2030 by Hardie Pulley, RN  VTE Prevention/Management: anticoagulant therapy  Goal: Optimal Comfort and Wellbeing  Outcome: Progressing  Goal: Readiness for Transition of Care  Outcome: Progressing  Goal: Rounds/Family Conference  Outcome: Progressing     Problem: Fall Injury Risk  Goal: Absence of Fall and Fall-Related Injury  Outcome: Progressing  Intervention: Promote Injury-Free Environment  Recent Flowsheet Documentation  Taken 10/10/2022 0000 by Hardie Pulley, RN  Safety Interventions: fall reduction program maintained  Taken 10/09/2022 2200 by Hardie Pulley, RN  Safety Interventions: fall reduction program maintained  Taken 10/09/2022 2000 by Hardie Pulley, RN  Safety Interventions: fall reduction program maintained     Problem: Fluid Volume Deficit  Goal: Fluid Balance  Outcome: Progressing

## 2022-10-10 NOTE — Unmapped (Signed)
Pt a&oX4, VSS on RA, denies discomfort. Pt has discharge order to home, AVS printed and reviewed with pt, pt aware of all follow up care. IV d/c'd, all belongings sent with pt, no questions at this time.   Problem: Adult Inpatient Plan of Care  Goal: Plan of Care Review  10/10/2022 1352 by Colen Darling, RN  Outcome: Discharged to Home  10/10/2022 1352 by Colen Darling, RN  Outcome: Discharged to Home  Goal: Patient-Specific Goal (Individualized)  10/10/2022 1352 by Colen Darling, RN  Outcome: Discharged to Home  10/10/2022 1352 by Colen Darling, RN  Outcome: Discharged to Home  Goal: Absence of Hospital-Acquired Illness or Injury  10/10/2022 1352 by Colen Darling, RN  Outcome: Discharged to Home  10/10/2022 1352 by Colen Darling, RN  Outcome: Discharged to Home  Intervention: Identify and Manage Fall Risk  Recent Flowsheet Documentation  Taken 10/10/2022 0800 by Colen Darling, RN  Safety Interventions: fall reduction program maintained  Goal: Optimal Comfort and Wellbeing  10/10/2022 1352 by Colen Darling, RN  Outcome: Discharged to Home  10/10/2022 1352 by Colen Darling, RN  Outcome: Discharged to Home  Goal: Readiness for Transition of Care  10/10/2022 1352 by Colen Darling, RN  Outcome: Discharged to Home  10/10/2022 1352 by Colen Darling, RN  Outcome: Discharged to Home  Goal: Rounds/Family Conference  10/10/2022 1352 by Colen Darling, RN  Outcome: Discharged to Home  10/10/2022 1352 by Colen Darling, RN  Outcome: Discharged to Home     Problem: Fall Injury Risk  Goal: Absence of Fall and Fall-Related Injury  10/10/2022 1352 by Colen Darling, RN  Outcome: Discharged to Home  10/10/2022 1352 by Colen Darling, RN  Outcome: Discharged to Home  Intervention: Promote Injury-Free Environment  Recent Flowsheet Documentation  Taken 10/10/2022 0800 by Colen Darling, RN  Safety Interventions: fall reduction program maintained     Problem: Fluid Volume Deficit  Goal: Fluid Balance  Outcome: Discharged to Home

## 2022-10-10 NOTE — Unmapped (Signed)
Physician Discharge Summary [facility]  2 [facility]  [address]  [place] [address]  Dept: [phone]  Loc: [phone]     Identifying Information:   Carol Caldwell  06/06/1955  [id number]    Primary Care Physician: [facility] [place]   Code Status: Full Code    Admit Date: 10/08/2022    Discharge Date: 10/10/2022     Discharge To: Home    Discharge Service: [facility] - [facility] [place] [facility]     Discharge Attending Physician: Axel Filler, MD    Discharge Diagnoses:  Principal Problem (Resolved):    AKI (acute kidney injury) (CMS-HCC)  Active Problems:    Controlled type 2 diabetes mellitus, without long-term current use of insulin (CMS-HCC)    Psoriatic arthritis (CMS-HCC)    Hypovolemia associated with diuresis    Hyponatremia    Hypomagnesemia      Outpatient Provider Follow Up Issues:   [ ]  Repeat BMP  [ ]  Discuss resuming Jardiance given well-controlled T2DM  [ ]  Repeat CBC and consider work up for possible macrocytic anemia    Hospital Course:   # AKI   # Hypovolemic hyponatremia  Cr 2.12 on admission with her baseline around 1. Creatine trending down after 1 L LR bolus. Electrolytes were repleted. Suspect prerenal etiology in setting of use of lasix, spirinolactone, enalapril, Jardiance all restarted at the same time and compounded by poor PO intake/diarrhea over past several days. FeUrea 13% which supports pre-renal etiology. UA microscopy without proteinuria or casts reassuring against nephritic/nephrotic disease and ATN. Patient voiding well so low c/f post-renal etiology. Creatinine on 6/28 (DOD) was 1.08. Blood pressures have been labile while in patient. Recommend restarting the lasix on a prn basis. Diabetes is well controlled and can continue to hold Jardiance.         Touchbase with Outpatient Provider:  DC summary routed to provider    Procedures:  None  No admission procedures for hospital encounter.  ______________________________________________________________________  Discharge Medications:     Your Medication List        STOP taking these medications      bromfenac 0.075 % Drop     empagliflozin 10 mg tablet  Commonly known as: JARDIANCE     polymyxin B sulf-trimethoprim 10,000 unit- 1 mg/mL Drop  Commonly known as: POLYTRIM     spironolactone 25 MG tablet  Commonly known as: ALDACTONE            CHANGE how you take these medications      furosemide 40 MG tablet  Commonly known as: LASIX  Take 1 tablet by mouth daily as needed for swelling. Weigh self daily, take 1 tablet if >3 lb weight gain in 24 hours or >5 lbs in 1 week  What changed: additional instructions            CONTINUE taking these medications      cholecalciferol (vitamin D3-10 mcg (400 unit)) 10 mcg (400 unit) Cap  Take 1 capsule (10 mcg total) by mouth daily.     DULoxetine 60 MG capsule  Commonly known as: CYMBALTA  Take 1 capsule (60 mg total) by mouth daily.     empty container Misc  Use as directed     enalapril 5 MG tablet  Commonly known as: VASOTEC  Take 1 tablet (5 mg total) by mouth daily.     gabapentin 300 MG capsule  Commonly known as: NEURONTIN  Take 2 capsules (600 mg total) by  mouth Three (3) times a day.     PRESERVISION AREDS-2 250-90-40-1 mg Cap  Generic drug: vit C,E-Zn-coppr-lutein-zeaxan  Take by mouth daily.     rosuvastatin 40 MG tablet  Commonly known as: CRESTOR  Take 1 tablet (40 mg total) by mouth daily.     SITagliptin phosphate 50 MG tablet  Commonly known as: JANUVIA  Take 1 tablet (50 mg total) by mouth every other day.     STELARA 45 mg/0.5 mL Syrg syringe  Generic drug: ustekinumab  Inject the contents of 1 syringe (45 mg total) under the skin every 8 weeks.     VOLTAREN 1 % gel  Generic drug: diclofenac sodium  Apply 2 g topically four (4) times a day as needed for pain.              Allergies:  Patient has no known allergies.  ______________________________________________________________________  Pending Test Results (if blank, then none):  Pending Labs       Order Current Status Urine Culture In process            Most Recent Labs:  All lab results last 24 hours -   Recent Results (from the past 24 hour(s))   POCT Glucose    Collection Time: 10/09/22  5:29 PM   Result Value Ref Range    Glucose, POC 198 (H) 70 - 179 mg/dL   POCT Glucose    Collection Time: 10/09/22  8:08 PM   Result Value Ref Range    Glucose, POC 219 (H) 70 - 179 mg/dL   POCT Glucose    Collection Time: 10/09/22  9:36 PM   Result Value Ref Range    Glucose, POC 209 (H) 70 - 179 mg/dL   CBC    Collection Time: 10/10/22  5:34 AM   Result Value Ref Range    WBC 3.1 (L) 3.6 - 11.2 10*9/L    RBC 3.03 (L) 3.95 - 5.13 10*12/L    HGB 10.4 (L) 11.3 - 14.9 g/dL    HCT 16.1 (L) 09.6 - 44.0 %    MCV 99.1 (H) 77.6 - 95.7 fL    MCH 34.5 (H) 25.9 - 32.4 pg    MCHC 34.8 32.0 - 36.0 g/dL    RDW 04.5 40.9 - 81.1 %    MPV 7.7 6.8 - 10.7 fL    Platelet 215 150 - 450 10*9/L   Basic metabolic panel    Collection Time: 10/10/22  5:34 AM   Result Value Ref Range    Sodium 137 135 - 145 mmol/L    Potassium 4.0 3.4 - 4.8 mmol/L    Chloride 104 98 - 107 mmol/L    CO2 25.6 20.0 - 31.0 mmol/L    Anion Gap 7 5 - 14 mmol/L    BUN 28 (H) 9 - 23 mg/dL    Creatinine 9.14 (H) 0.55 - 1.02 mg/dL    BUN/Creatinine Ratio 26     eGFR CKD-EPI (2021) Female 56 (L) >=60 mL/min/1.18m2    Glucose 157 70 - 179 mg/dL    Calcium 9.2 8.7 - 78.2 mg/dL   Magnesium Level    Collection Time: 10/10/22  5:34 AM   Result Value Ref Range    Magnesium 1.8 1.6 - 2.6 mg/dL   POCT Glucose    Collection Time: 10/10/22  7:52 AM   Result Value Ref Range    Glucose, POC 153 70 - 179 mg/dL   POCT Glucose    Collection Time:  10/10/22 11:33 AM   Result Value Ref Range    Glucose, POC 174 70 - 179 mg/dL       Relevant Studies/Radiology (if blank, then none):  No results found.  ______________________________________________________________________  Discharge Instructions:                        Appointments which have been scheduled for you      Oct 15, 2022 10:20 AM  Appointment with Jodi Marble, AGNP  [address] , [facility], [place] [address]    In person hospital follow up appointment at [facility].  Phone:  [phone]     Nov 04, 2022 9:30 AM  (Arrive by 9:15 AM)  RETURN  RHEUMATOLOGY with Otho Perl, MD  [facility] RHEUMATOLOGY SPECIALTY [place] [facility]) [address]  [facility] [address] through [address]  [place] [address]  [phone]        Dec 22, 2022 2:00 PM  (Arrive by 1:50 PM)  RETURN CARDIOLOGY with Yehuda Budd, MD  [facility]) [address] [place] [address]  [phone]             ______________________________________________________________________  Discharge Day Services:  BP 145/76  - Pulse 85  - Temp 36.5 ??C (97.7 ??F) (Oral)  - Resp 18  - Ht 170.2 cm (5' 7)  - Wt 74.5 kg (164 lb 4.8 oz)  - SpO2 97%  - BMI 25.73 kg/m??   Pt seen on the day of discharge and determined appropriate for discharge.    GEN: well appearing, lying in bed, NAD   HEENT: NCAT, MMM. EOMI.  Neck: Supple.  CV: Regular rate and rhythm. No murmurs/rubs/gallops.  Pulm: CTAB. No wheezing, crackles, or rhonchi.  Abd: Flat.  Nontender. No guarding, rebound.  Normoactive bowel sounds.    Neuro: A&O x 3. No focal deficits.   Ext: No peripheral edema.  Palpable distal pulses.    Condition at Discharge: Good    Length of Discharge: I spent greater than 30 mins in the discharge of this patient.    Arlina Robes, DO  [facility], PGY2

## 2022-10-10 NOTE — Unmapped (Signed)
Pt oriented, VSS. LR bolus given today and compression socks placed bilateral LE. Pt having adequate output. Call bell in reach.  Problem: Adult Inpatient Plan of Care  Goal: Plan of Care Review  Outcome: Progressing  Goal: Patient-Specific Goal (Individualized)  Outcome: Progressing  Goal: Absence of Hospital-Acquired Illness or Injury  Outcome: Progressing  Intervention: Identify and Manage Fall Risk  Recent Flowsheet Documentation  Taken 10/09/2022 1800 by Vincent Gros, RN  Safety Interventions: nonskid shoes/slippers when out of bed  Taken 10/09/2022 1600 by Vincent Gros, RN  Safety Interventions: nonskid shoes/slippers when out of bed  Taken 10/09/2022 1400 by Vincent Gros, RN  Safety Interventions: fall reduction program maintained  Taken 10/09/2022 1200 by Vincent Gros, RN  Safety Interventions: fall reduction program maintained  Taken 10/09/2022 1000 by Vincent Gros, RN  Safety Interventions: fall reduction program maintained  Taken 10/09/2022 0800 by Vincent Gros, RN  Safety Interventions: fall reduction program maintained  Intervention: Prevent and Manage VTE (Venous Thromboembolism) Risk  Recent Flowsheet Documentation  Taken 10/09/2022 1415 by Vincent Gros, RN  Anti-Embolism Device Type: TED, Knee High  Anti-Embolism Intervention: On  Anti-Embolism Device Location: BLE  Goal: Optimal Comfort and Wellbeing  Outcome: Progressing  Goal: Readiness for Transition of Care  Outcome: Progressing  Goal: Rounds/Family Conference  Outcome: Progressing     Problem: Fall Injury Risk  Goal: Absence of Fall and Fall-Related Injury  Outcome: Progressing  Intervention: Promote Injury-Free Environment  Recent Flowsheet Documentation  Taken 10/09/2022 1800 by Vincent Gros, RN  Safety Interventions: nonskid shoes/slippers when out of bed  Taken 10/09/2022 1600 by Vincent Gros, RN  Safety Interventions: nonskid shoes/slippers when out of bed  Taken 10/09/2022 1400 by Vincent Gros, RN  Safety Interventions: fall reduction program maintained  Taken 10/09/2022 1200 by Vincent Gros, RN  Safety Interventions: fall reduction program maintained  Taken 10/09/2022 1000 by Vincent Gros, RN  Safety Interventions: fall reduction program maintained  Taken 10/09/2022 0800 by Vincent Gros, RN  Safety Interventions: fall reduction program maintained     Problem: Fluid Volume Deficit  Goal: Fluid Balance  Outcome: Progressing

## 2022-10-14 MED ORDER — FUROSEMIDE 40 MG TABLET
ORAL_TABLET | 0 refills | 0 days
Start: 2022-10-14 — End: ?

## 2022-10-14 NOTE — Unmapped (Signed)
Lasix refill refused due to being a duplicate. Prescription was discontinued  and rewritten by Dr. Valinda Hoar on 10/10/2022. Spoke with Carol Caldwell to explain that this looks like  a denial but she does currently have an active prescription for the same medication. Carol Caldwell was instructed to reach out to our office for any future lasix/ refill needs. Patient voiced understanding and all questions answered at this time.

## 2022-11-04 ENCOUNTER — Ambulatory Visit: Admit: 2022-11-04 | Discharge: 2022-11-05 | Payer: MEDICARE

## 2022-11-04 DIAGNOSIS — M19041 Primary osteoarthritis, right hand: Principal | ICD-10-CM

## 2022-11-04 DIAGNOSIS — M654 Radial styloid tenosynovitis [de Quervain]: Principal | ICD-10-CM

## 2022-11-04 DIAGNOSIS — L4050 Arthropathic psoriasis, unspecified: Principal | ICD-10-CM

## 2022-11-04 DIAGNOSIS — M19042 Primary osteoarthritis, left hand: Principal | ICD-10-CM

## 2022-11-04 DIAGNOSIS — L409 Psoriasis, unspecified: Principal | ICD-10-CM

## 2022-11-04 MED ORDER — USTEKINUMAB 45 MG/0.5 ML SUBCUTANEOUS SYRINGE
SUBCUTANEOUS | 3 refills | 224 days | Status: CP
Start: 2022-11-04 — End: ?
  Filled 2022-12-04: qty 0.5, 56d supply, fill #0

## 2022-11-04 MED ORDER — DICLOFENAC 1 % TOPICAL GEL
Freq: Four times a day (QID) | TOPICAL | 11 refills | 57 days | Status: CP | PRN
Start: 2022-11-04 — End: ?

## 2022-11-04 NOTE — Unmapped (Signed)
Last clinic visit: Sept 2023     Accompanied by: alone     Chief complaint: follow-up Psoriatic Arthritis (PsA)      History of Present Illness:     HPI:  Clark Nick is a 67 y.o. 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 We have avoided mtx/leflunomide due to alcohol use. 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     Treatment history:  - We have avoided mtx/leflunomide due to alcohol use.   - Humira started 09/2016  - Switched to Cosentyx 150 mg q4 weeks 06/2020  - Increased to Cosentyx 300 mg q4 weeks in 09/2020  - Cosentyx changed to Stelara in 02/2021 due to persistent back pain    Current Treatment:  - Stelara 45 mg sq q8 weeks    Interval History:  - Feeling well. No complaints of pain today.   - Right thumb sometimes hurts, resolves with topical voltaren gel. Occasionally takes ibuprofen which helps.   - Low back sometimes hurts, not every day. Gabapentin helps. Pain worsens closer to the time of needing her Stelara injections.   - States she has some psoriasis on both feet, but has not notices it anywhere else. It does not itch. She uses Vaseline which helps.   - No complaints with Stelara. No injection site reactions.   - Has been staying active, walks around the house.   - Denies AM stiffness.   - Takes Cymbalta about twice per week.     Objective    Physical Exam:  Vitals:    11/04/22 0920   BP: 155/85   Pulse: 92   Weight: 73.3 kg (161 lb 9.6 oz)   Height: 157.5 cm (5' 2)   Body mass index is 29.56 kg/m??.  GENERAL: The patient is well appearing, in no acute distress. Ambulates around exam room easily and climbs on exam table without difficulty.  SKIN: No rash.   EYES: PERRL. Sclera anicteric, conjunctiva non-injected.   ENT: mucus membranes moist.   Neck: supple, no cervical lymphadenopathy  Respiratory: Breathing non-labored, CTA bilaterally  CV: Heart rate regular, no murmurs  VASCULAR: warm and well perfused extremities. BLE mild edema.   NEURO: CN 2-12 grossly intact.   PSYCH: No depression or anxiety. Cooperative. Alert and oriented.   MUSCULOSKELETAL:   Bilateral shoulders, elbows, wrists, hands, fingers:  No deformity, erythema, warmth, swelling, effusion, tenderness, or limited ROM.?? Able to curl all fingers. Prayer sign negative. Spine nontender to palpation. Except: Mild TTP SI region bilaterally  Bilateral knees, ankles, feet, toes: No deformity, erythema, warmth, swelling, effusion, tenderness, or limited ROM. MTP squeeze negative.     Assessment/Plan:     Shainna Lockheart is a 68 y.o. female with a history of Psoriatic Arthritis (PsA) characterized by skin psoriasis and sacroiliitis. Patient is maintained on Stelara q8 weeks and is satisfied with current treatment.     Psoriasis and Psoriatic Arthritis: Currently feeling well on Stelara q8 weeks. Today pt tells me she is satisfied with current treatment for Psoriatic Arthritis (PsA). Mild psoriasis on feet, not seen derm in several years.   - Continue Stelara q8 weeks.   - Discussed option of referral to derm to discuss topicals or other treatment for psoriasis which she deferred today.      High risk medication monitoring:   - Patient is currently taking Stelara; no  regular monitoring needed  - Patient had a quant gold in 09/03/2016 that was neg.  she does not have any risk factors for TB exposure - no travel to TB-endemic area, does not volunteer, work, or live at [employer] or correctional facility, does not care for TB patients, no contact with individual with active pulmonary TB. Does not require repeat quant gold unless she has new risk factors for TB exposure. TB risk factors were reviewed: 12/19/2021      Immunization Counseling:  Prevnar PCV-20: 10/03/2020  Recommended influenza and COVID vaccinations closer to this fall.      We discussed the above including diagnosis and recommendations, agreed on the above plan, and all questions were answered.    Follow-up: Return for Follow-up PsA in 12 months.    Metro Kung, PGY-1  Physical Medicine & Rehab    Diagnoses and all orders for this visit:    Psoriatic arthritis (CMS-HCC)  -     ustekinumab (STELARA) 45 mg/0.5 mL Syrg syringe; Inject the contents of 1 syringe (45 mg total) under the skin every 8 weeks.    Tendinitis, de Quervain's  -     diclofenac sodium (VOLTAREN) 1 % gel; Apply 2 g topically four (4) times a day as needed for pain.

## 2022-11-20 NOTE — Unmapped (Signed)
[facility] Refill Coordination Note    Specialty Medication(s) to be Shipped:   Inflammatory Disorders: Stelara    Other medication(s) to be shipped: No additional medications requested for fill at this time     Carol Caldwell, DOB: 1955-05-11  Phone: [phone] (home)       All above HIPAA information was verified with patient.     Was a Nurse, learning disability used for this call? No    Completed refill call assessment today to schedule patient's medication shipment from the [facility] [phone]).  All relevant notes have been reviewed.     Specialty medication(s) and dose(s) confirmed: Regimen is correct and unchanged.   Changes to medications: Martavia reports no changes at this time.  Changes to insurance: No  New side effects reported not previously addressed with a pharmacist or physician: None reported  Questions for the pharmacist: No    Confirmed patient received a Conservation officer, historic buildings and a Surveyor, mining with first shipment. The patient will receive a drug information handout for each medication shipped and additional FDA Medication Guides as required.       DISEASE/MEDICATION-SPECIFIC INFORMATION        For patients on injectable medications: Patient currently has 0 doses left.  Next injection is scheduled for 12/15/22.    SPECIALTY MEDICATION ADHERENCE     Medication Adherence    Patient reported X missed doses in the last month: 0  Specialty Medication: Stelara 45mg  q 8 weeks  Patient is on additional specialty medications: No  Patient is on more than two specialty medications: No  Informant: patient              Were doses missed due to medication being on hold? No    Stelara 45  mg/0.37ml : 21 days of medicine on hand       REFERRAL TO PHARMACIST     Referral to the pharmacist: Not needed      [facility]     Shipping address confirmed in Epic.       Delivery Scheduled: Yes, Expected medication delivery date: 12/04/22.     Medication will be delivered via Same Day Courier to the prescription address in Epic WAM.    Darryl Nestle, PharmD   [facility] Specialty Pharmacist

## 2022-12-26 ENCOUNTER — Ambulatory Visit: Admit: 2022-12-26 | Discharge: 2022-12-27 | Payer: MEDICARE | Attending: Internal Medicine | Primary: Internal Medicine

## 2022-12-26 DIAGNOSIS — E871 Hypo-osmolality and hyponatremia: Principal | ICD-10-CM

## 2022-12-26 DIAGNOSIS — E785 Hyperlipidemia, unspecified: Principal | ICD-10-CM

## 2022-12-26 DIAGNOSIS — I10 Essential (primary) hypertension: Principal | ICD-10-CM

## 2022-12-26 DIAGNOSIS — R600 Localized edema: Principal | ICD-10-CM

## 2022-12-26 LAB — BASIC METABOLIC PANEL
ANION GAP: 11 mmol/L
BLOOD UREA NITROGEN: 22 mg/dL (ref 5–26)
BUN / CREAT RATIO: 22 (ref 12–25)
CALCIUM: 8.8 mg/dL (ref 8.5–10.5)
CHLORIDE: 104 mmol/L (ref 95–110)
CO2: 25.5 mmol/L (ref 21.0–31.0)
CREATININE: 0.98 mg/dL (ref 0.50–1.50)
EGFR CKD-EPI (2021) FEMALE: 63 mL/min/{1.73_m2} (ref >=60)
GLUCOSE RANDOM: 178 mg/dL — ABNORMAL HIGH (ref 60–100)
POTASSIUM: 4 mmol/L (ref 3.5–5.1)
SODIUM: 140 mmol/L (ref 136–145)

## 2022-12-26 LAB — MAGNESIUM: MAGNESIUM: 1.6 mg/dL (ref 1.6–2.6)

## 2022-12-26 NOTE — Unmapped (Addendum)
Assessment and Plan:   Carol Caldwell is a 67 y.o. female with a history of diabetes, psoriatic arthritis and hyperlipidemia who presents in clinic today for follow-up.    1.  Lower extremity edema, metabolic disturbance  Reviewed recent clinical course with patient, she developed acute kidney injury and electrolyte abnormalities including hyponatremia in June, likely related to GI illness in the setting of diuretic use.  Spironolactone and Jardiance have been held, has only been using furosemide as needed.  Does note weight gain in the interim and has bilateral lower extremity edema today.  Reviewed TTE 02/2022 showing normal EF, unable to assess diastolic function on account of mitral annular calcification.  She reports diabetes medications have not been adjusted at all since Jardiance was discontinued.  At this point, we will repeat chemistry profile and tentatively restart Jardiance.  I have asked her to monitor blood sugars at home (which she reports have not been low) and notify her PCP if they start to run low.  Plan follow-up in 1 month to reassess clinical status.  Advised her to continue using furosemide as needed.    2.  Hyperlipidemia  Reviewed labs 08/2022, lipids well-controlled with non-HDL less than 100.  Continue rosuvastatin at current dose.    3.  Hypertension  Blood pressure above goal in the office, home readings generally similar.  Will chemistry profile, restart Jardiance as above and reassess in 1 month.      [name], MD  [facility]--Cardiology  Office [phone]  Office [phone]      Subjective:   PCP: Elza Rafter  Patient: Carol Caldwell  DOB: 04/24/1955    Reason for visit:  Edema  HPI: Carol Caldwell is a 67 y.o. female with a history of diabetes who presents in clinic today for follow-up.  Since her last visit, patient was hospitalized with prerenal acute kidney injury and electrolyte abnormalities.  These improved with hydration. Jardiance and spironolactone were held.  Since then, she generally reports feeling well.  Denies chest pain, shortness of breath, palpitations, or near syncope.  She has noted ongoing lower extremity swelling.  It does not bother her much however.  She takes furosemide on rare occasion.    ______________________________________________________________________    Pertinent Medical History, Cardiovascular History & Procedures:    Pertinent PMH:  Diabetes  Psoriatic arthritis  Vitamin D deficiency  Hypertension  Hyperlipidemia    Cath / PCI:  None     CV Surgery:   None     EP Procedures and Devices:  None     Non-Invasive Evaluation(s):  ZIO monitor November' 23: Underlying sinus average rate 95 bpm; rare PACs 5 episodes of SVT longest lasting 10 beats at 143 bpm, rare PVCs, no wide-complex tachycardia; symptoms were associated with sinus rhythm, PACs, PVCs.  TTE November' 23: Mildly increased wall thickness, EF 55% sign; trivial MR and mild mitral annular calcification  Myocardial perfusion study 7/19: Equivocal due to marked adjacent gut activity overlying inferolateral segments.  No significant ischemia is appreciated involving any other segments  Stress echocardiogram 1/19: No echocardiographic evidence of inducible ischemia but patient only exercised 3 minutes and 6 seconds on Bruce protocol  6/18: Normal EF, aortic sclerosis, dilated PA, mild pulmonary hypertension    ______________________________________________________________________    Other past medical history, social history, family history, medications, allergies and problem list reviewed in the medical record.    Current cardiac medications include:  Furosemide 40 mg as needed  Enalapril 5 mg daily  Rosuvastatin 40 mg daily  NEW TODAY: Jardiance 10 mg once daily      Objective:     BP 140/78 (BP Site: L Arm, BP Position: Sitting, BP Cuff Size: Medium)  - Pulse 86  - Wt 77.6 kg (171 lb)  - SpO2 96%  - BMI 31.28 kg/m??     PHYSICAL EXAMINATION:   GENERAL: Alert, NAD  CARDIOVASCULAR:  Regular rate and rhythm, normal S1/S2, no murmurs, rubs, or gallops.  1+ minimally pitting b/l LE edema.  RESPIRATORY:  Clear to auscultation bilaterally.  No wheezes, crackles, or rhonchi. Normal work of breathing.    ______________________________________________________________________    EKG 08/16/22 shows sinus rhythm, normal axis,prolonged QTc at ,  and no diagnostic ischemic changes. Since last tracing 02/17/22 rate has decreased by 10bpm. QTc increased by 21ms      Recent CV pertinent labs:  Lab Results   Component Value Date    LDL Calculated 81 08/19/2022    Non-HDL Cholesterol 97 08/19/2022    HDL 99 (H) 08/19/2022    Creatinine 1.08 (H) 10/10/2022    Creatinine 0.8 10/04/2018    Creatinine Whole Blood, POC 0.7 05/16/2016    Potassium 4.0 10/10/2022    Potassium 3.8 10/04/2018    BUN 28 (H) 10/10/2022    BUN 16.00 10/04/2018    TSH 1.991 08/19/2022

## 2022-12-26 NOTE — Unmapped (Addendum)
RESTART Jardiance 10 mg once daily today    Monitor your blood sugars and notify your primary care provider if they start to run low

## 2023-01-20 NOTE — Unmapped (Signed)
[facility] Refill Coordination Note    Specialty Medication(s) to be Shipped:   Inflammatory Disorders: Stelara    Other medication(s) to be shipped: No additional medications requested for fill at this time     Carol Caldwell, DOB: 09-27-1955  Phone: [phone] (home)       All above HIPAA information was verified with patient.     Was a Nurse, learning disability used for this call? No    Completed refill call assessment today to schedule patient's medication shipment from the [facility]  [phone]).  All relevant notes have been reviewed.     Specialty medication(s) and dose(s) confirmed: Regimen is correct and unchanged.   Changes to medications: Lynze reports no changes at this time.  Changes to insurance: No  New side effects reported not previously addressed with a pharmacist or physician: None reported  Questions for the pharmacist: No    Confirmed patient received a Conservation officer, historic buildings and a Surveyor, mining with first shipment. The patient will receive a drug information handout for each medication shipped and additional FDA Medication Guides as required.       DISEASE/MEDICATION-SPECIFIC INFORMATION        For patients on injectable medications: Patient currently has 0 doses left.  Next injection is scheduled for 01/21/23.    SPECIALTY MEDICATION ADHERENCE     Medication Adherence    Patient reported X missed doses in the last month: 0  Specialty Medication: STELARA 45 mg/0.5 mL Syrg syringe (ustekinumab)  Patient is on additional specialty medications: No  Informant: patient              Were doses missed due to medication being on hold? No     STELARA 45 mg/0.5 mL Syrg syringe (ustekinumab): 0 days of medicine on hand       REFERRAL TO PHARMACIST     Referral to the pharmacist: Not needed      [facility]     Shipping address confirmed in Epic.       Delivery Scheduled: Yes, Expected medication delivery date: 01/22/23.     Medication will be delivered via Same Day Courier to the prescription address in Epic WAM.    Craige Cotta   [facility]  Specialty Technician

## 2023-01-22 MED FILL — STELARA 45 MG/0.5 ML SUBCUTANEOUS SYRINGE: SUBCUTANEOUS | 56 days supply | Qty: 0.5 | Fill #1

## 2023-01-28 NOTE — Unmapped (Signed)
Assessment and Plan:   Carol Caldwell is a 67 y.o. female with a history of diabetes, psoriatic arthritis and hyperlipidemia who presents in clinic today for follow-up.       1.  Lower extremity edema, metabolic disturbance   Reviewed TTE 02/2022 showing normal EF, unable to assess diastolic function on account of mitral annular calcification.Spironolactone and Jardiance were held at June hospitalization for AKI, electrolyte abnormalities 2/2 diuretic use in setting of GI illness.   Noted weight gain and edema at visit last month with Dr. Zenaida Deed.   Restarted jardiance. Tolerating it well but doesn't notice improvement in edema.   Taking 40mg  lasix every other day but doesn't notice much increased UOP. Says it was well controlled with spiro as part of her regimen.   Weight is down 3# since last visit.   Recheck chem panel,proBNP, Mg today and base next steps on results - possibly adding back spiro for edema and BP vs short trial higher dose lasix.     Reminded her to hold jardiance in setting of  poor po intake.   Also reviewed low Na intake.     Will plan follow up in 2 weeks, anticipating medication adjustment.     2.  Hyperlipidemia  Reviewed labs 08/2022, lipids well-controlled with non-HDL less than 100.    Continue rosuvastatin at current dose.    3.  Hypertension  Blood pressure above goal in the office, home readings generally similar.    Repeat chem panel and will adjust antiHTN accordingly - consideration for increasing lisinopril vs adding back spiro with close follow up to assess efficacy and labs.   Pt agrees with plan.     She would also like repeat A1C which is reasonable since restarting Jardiance though is a little early. Will add CBC to follow anemia and Vit D as well since checking other labs. Will send result to her PCP.       Orders Placed This Encounter   Procedures    Basic Metabolic Panel    Magnesium Level    Vitamin D 25 Hydroxy (25OH D2 + D3)    CBC    Pro-BNP       Requested Prescriptions      No prescriptions requested or ordered in this encounter         Return in about 2 weeks (around 02/12/2023) for Follow up with Maralyn Sago; labs today .          Subjective:   PCP: Elza Rafter  Patient: Carol Caldwell  DOB: [birth year]    Reason for visit:  Edema, HTN   HPI: Carol Caldwell is a 67 y.o. female with a history of diabetes, HLD, psoriatic arthritis who presents in clinic today for follow-up.     At last visit with Dr. Zenaida Deed a month ago, jardiance was reinitiated after having been held during hospitalization in June for prerenal AKI and electrolyte abnormalities.   She says  I feel good.   Edema is  up and down. She is taking lasix every other day. Doesn't notice much increased in UOP with it. Doesn't seem better with Jardiance. Reports edema was well controlled with spiro as part of her regimen.     She tries to do low salt, no pork, minimal fried food bit does admit to eating a lot of crackers and cheese.      Denies chest pain, shortness of breath, palpitations, or near syncope.      She'd like  an A1c   Send results to her PCP - Carol Caldwell at [facility].  She thinks she has been taking Vitamin D in her preservison ( doesn't have Vit D)        ______________________________________________________________________    Pertinent Medical History, Cardiovascular History & Procedures:    Pertinent PMH:  Diabetes  Psoriatic arthritis  Vitamin D deficiency  Hypertension  Hyperlipidemia    Cath / PCI:  None     CV Surgery:   None     EP Procedures and Devices:  None     Non-Invasive Evaluation(s):  ZIO monitor November' 23: Underlying sinus average rate 95 bpm; rare PACs 5 episodes of SVT longest lasting 10 beats at 143 bpm, rare PVCs, no wide-complex tachycardia; symptoms were associated with sinus rhythm, PACs, PVCs.  TTE November' 23: Mildly increased wall thickness, EF 55% sign; trivial MR and mild mitral annular calcification  Myocardial perfusion study 7/19: Equivocal due to marked adjacent gut activity overlying inferolateral segments.  No significant ischemia is appreciated involving any other segments  Stress echocardiogram 1/19: No echocardiographic evidence of inducible ischemia but patient only exercised 3 minutes and 6 seconds on Bruce protocol  6/18: Normal EF, aortic sclerosis, dilated PA, mild pulmonary hypertension    ______________________________________________________________________    Other past medical history, social history, family history, medications, allergies and problem list reviewed in the medical record.    Current cardiac medications include:  Furosemide 40 mg as needed  Enalapril 5 mg daily  Rosuvastatin 40 mg daily  Jardiance 10 mg once daily      Objective:     BP 140/68  - Pulse 96  - Wt 76.2 kg (168 lb)  - SpO2 100%  - BMI 30.73 kg/m??     Wt Readings from Last 6 Encounters:   01/29/23 76.2 kg (168 lb)   12/26/22 77.6 kg (171 lb)   11/04/22 73.3 kg (161 lb 9.6 oz)   10/09/22 74.5 kg (164 lb 4.8 oz)   09/16/22 74.8 kg (165 lb)   08/19/22 75.8 kg (167 lb)         PHYSICAL EXAMINATION:   GENERAL:  Alert, NAD  CARDIOVASCULAR:  Regular rate and rhythm, normal S1/S2, no murmurs, rubs, or gallops.  1+ minimally pitting b/l LE edema.  RESPIRATORY:  Clear to auscultation bilaterally.  No wheezes, crackles, or rhonchi. Normal work of breathing.    ______________________________________________________________________    EKG 08/16/22 shows sinus rhythm, normal axis,prolonged QTc at ,  and no diagnostic ischemic changes. Since last tracing 02/17/22 rate has decreased by 10bpm. QTc increased by 21ms      Recent CV pertinent labs:  Lab Results   Component Value Date    LDL Calculated 81 08/19/2022    Non-HDL Cholesterol 97 08/19/2022    HDL 99 (H) 08/19/2022    Creatinine 0.98 12/26/2022    Creatinine 0.8 10/04/2018    Creatinine Whole Blood, POC 0.7 05/16/2016    Potassium 4.0 12/26/2022    Potassium 3.8 10/04/2018    BUN 22 12/26/2022    BUN 16.00 10/04/2018    TSH 1.991 08/19/2022

## 2023-01-29 ENCOUNTER — Ambulatory Visit: Admit: 2023-01-29 | Discharge: 2023-01-30 | Payer: MEDICARE

## 2023-01-29 DIAGNOSIS — D649 Anemia, unspecified: Principal | ICD-10-CM

## 2023-01-29 DIAGNOSIS — R600 Localized edema: Principal | ICD-10-CM

## 2023-01-29 DIAGNOSIS — E118 Type 2 diabetes mellitus with unspecified complications: Principal | ICD-10-CM

## 2023-01-29 DIAGNOSIS — Z5181 Encounter for therapeutic drug level monitoring: Principal | ICD-10-CM

## 2023-01-29 DIAGNOSIS — E559 Vitamin D deficiency, unspecified: Principal | ICD-10-CM

## 2023-01-29 DIAGNOSIS — I10 Essential (primary) hypertension: Principal | ICD-10-CM

## 2023-01-29 LAB — CBC
HEMATOCRIT: 31.4 % — ABNORMAL LOW (ref 35.0–45.0)
HEMOGLOBIN: 10.6 g/dL — ABNORMAL LOW (ref 11.3–15.5)
MEAN CORPUSCULAR HEMOGLOBIN CONC: 33.6 g/dL (ref 31.0–36.0)
MEAN CORPUSCULAR HEMOGLOBIN: 33.2 pg — ABNORMAL HIGH (ref 26.0–32.0)
MEAN CORPUSCULAR VOLUME: 99 fL — ABNORMAL HIGH (ref 89.0–97.0)
MEAN PLATELET VOLUME: 6.4 fL — ABNORMAL LOW (ref 6.5–8.9)
PLATELET COUNT: 245 10*9/L (ref 140–440)
RED BLOOD CELL COUNT: 3.18 10*12/L — ABNORMAL LOW (ref 3.80–5.10)
RED CELL DISTRIBUTION WIDTH: 16.1 % — ABNORMAL HIGH (ref 11.0–15.0)
WBC ADJUSTED: 3.4 10*9/L — ABNORMAL LOW (ref 3.8–10.8)

## 2023-01-29 LAB — BASIC METABOLIC PANEL
ANION GAP: 3 mmol/L — ABNORMAL LOW (ref 5–14)
BLOOD UREA NITROGEN: 24 mg/dL — ABNORMAL HIGH (ref 9–23)
BUN / CREAT RATIO: 20
CALCIUM: 8.9 mg/dL (ref 8.7–10.4)
CHLORIDE: 107 mmol/L (ref 98–107)
CO2: 27 mmol/L (ref 20.0–31.0)
CREATININE: 1.19 mg/dL — ABNORMAL HIGH
EGFR CKD-EPI (2021) FEMALE: 50 mL/min/{1.73_m2} — ABNORMAL LOW (ref >=60)
GLUCOSE RANDOM: 181 mg/dL — ABNORMAL HIGH (ref 70–179)
POTASSIUM: 3.6 mmol/L (ref 3.4–4.8)
SODIUM: 137 mmol/L (ref 135–145)

## 2023-01-29 LAB — MAGNESIUM: MAGNESIUM: 1.5 mg/dL — ABNORMAL LOW (ref 1.6–2.6)

## 2023-01-29 LAB — PRO-BNP: PRO-BNP: 225 pg/mL (ref <=300.0)

## 2023-01-30 NOTE — Unmapped (Signed)
I spoke to patient     I explained that Carol Caldwell reviewed her recent lab work and that her magnesium is a little low.  Her anemia and kidney function is stable.    Carol Caldwell would like to address her blood pressure by increasing her enalapril to 5 mg twice daily.  Take one in the morning and one in the evening.  Patient stated she has enough enalapril to increase dose and does not need a refill at this time.      Patient was asked to please let us know if she experiences any lightheadedness after increasing her enalapril dose.      Carol Caldwell will repeat labs at next appointment    Please call if any questions or problems.      The patient voiced understanding, had no additional questions and was very appreciative.

## 2023-01-30 NOTE — Unmapped (Signed)
-----   Message from Dierdre Harness, [place] sent at 01/29/2023  6:30 PM EDT -----  Pt doesn't use My Chart  - will you please call her and let her know her labs from yesterday showed magnesium level is a little low, anemia is stable, and kidney function is stable.     I'd like to address her blood pressure by increasing her enalapril to 5mg  twice daily. We'll repeat blood work at her upcoming appt on Halloween. Let me know before then if she has any lightheadedness with higher dose. Also let me know if she will have enough pills to make it to our next appt.     Thanks  Ss

## 2023-02-02 LAB — VITAMIN D 25 HYDROXY: VITAMIN D, TOTAL (25OH): 10.9 ng/mL — ABNORMAL LOW (ref 20.0–80.0)

## 2023-02-04 NOTE — Unmapped (Signed)
Copy of labs faxed to PCP. 

## 2023-02-04 NOTE — Unmapped (Signed)
Attempted to call patient in regards to results. Went to [employer]. Unable to leave message due to full mailbox.

## 2023-02-04 NOTE — Unmapped (Signed)
-----   Message from Dierdre Harness, [place] sent at 02/04/2023  2:53 PM EDT -----  Will you let her know that her Vit D level remains really low. Her PCP will need to address this at their next visit. If she doesn't have a PCP visit soon, it would be a good idea to make one.     Would you also fax vit d level and her labs from last week  to her PCP? Thanks!   Maralyn Sago

## 2023-02-05 NOTE — Unmapped (Signed)
 Attempted to call patient in regards to results. Went to [employer]. Unable to leave message due to full mailbox.

## 2023-02-09 NOTE — Unmapped (Signed)
Called and informed Carol Caldwell that Maralyn Sago reviewed her labs and her vitamin D level is still low and that Maralyn Sago would suggest she make an appointment with her PCP to further address this. She voiced understanding. I let her know I had faxed the result to her PCP.

## 2023-02-11 NOTE — Unmapped (Unsigned)
Assessment and Plan:   Carol Caldwell is a 67 y.o. female with a history of diabetes, psoriatic arthritis and hyperlipidemia who presents in clinic today for follow-up.     PVP:     Follow up 10/17 visit with me - increased enalapril to 5mg  BID   Repeat BMP today       PLAN:       Notes from previous visit.     1.  Lower extremity edema, metabolic disturbance   Reviewed TTE 02/2022 showing normal EF, unable to assess diastolic function on account of mitral annular calcification.Spironolactone and Jardiance were held at June hospitalization for AKI, electrolyte abnormalities 2/2 diuretic use in setting of GI illness.   Noted weight gain and edema at visit last month with Dr. Zenaida Deed.   Restarted jardiance. Tolerating it well but doesn't notice improvement in edema.   Taking 40mg  lasix every other day but doesn't notice much increased UOP. Says it was well controlled with spiro as part of her regimen.   Weight is down 3# since last visit.   Recheck chem panel,proBNP, Mg today and base next steps on results - possibly adding back spiro for edema and BP vs short trial higher dose lasix.     Reminded her to hold jardiance in setting of  poor po intake.   Also reviewed low Na intake.     Will plan follow up in 2 weeks, anticipating medication adjustment.     2.  Hyperlipidemia  Reviewed labs 08/2022, lipids well-controlled with non-HDL less than 100.    Continue rosuvastatin at current dose.    3.  Hypertension  Blood pressure above goal in the office, home readings generally similar.    Repeat chem panel and will adjust antiHTN accordingly - consideration for increasing lisinopril vs adding back spiro with close follow up to assess efficacy and labs.   Pt agrees with plan.     She would also like repeat A1C which is reasonable since restarting Jardiance though is a little early. Will add CBC to follow anemia and Vit D as well since checking other labs. Will send result to her PCP.       No orders of the defined types were placed in this encounter.      Requested Prescriptions      No prescriptions requested or ordered in this encounter         No follow-ups on file.          Subjective:   PCP: Elza Rafter  Patient: Carol Caldwell  DOB: May 21, 1955    Reason for visit:  Edema, HTN   HPI: Carol Caldwell is a 67 y.o. female with a history of diabetes, HLD, psoriatic arthritis who presents in clinic today for follow-up.     At last visit with Dr. Zenaida Deed a month ago, jardiance was reinitiated after having been held during hospitalization in June for prerenal AKI and electrolyte abnormalities.   She says  I feel good.   Edema is  up and down. She is taking lasix every other day. Doesn't notice much increased in UOP with it. Doesn't seem better with Jardiance. Reports edema was well controlled with spiro as part of her regimen.     She tries to do low salt, no pork, minimal fried food bit does admit to eating a lot of crackers and cheese.      Denies chest pain, shortness of breath, palpitations, or near syncope.  She'd like an A1c   Send results to her PCP - Jodi Marble at [facility].  She thinks she has been taking Vitamin D in her preservison ( doesn't have Vit D)        ______________________________________________________________________    Pertinent Medical History, Cardiovascular History & Procedures:    Pertinent PMH:  Diabetes  Psoriatic arthritis  Vitamin D deficiency  Hypertension  Hyperlipidemia    Cath / PCI:  None     CV Surgery:   None     EP Procedures and Devices:  None     Non-Invasive Evaluation(s):  ZIO monitor November' 23: Underlying sinus average rate 95 bpm; rare PACs 5 episodes of SVT longest lasting 10 beats at 143 bpm, rare PVCs, no wide-complex tachycardia; symptoms were associated with sinus rhythm, PACs, PVCs.  TTE November' 23: Mildly increased wall thickness, EF 55% sign; trivial MR and mild mitral annular calcification  Myocardial perfusion study 7/19: Equivocal due to marked adjacent gut activity overlying inferolateral segments.  No significant ischemia is appreciated involving any other segments  Stress echocardiogram 1/19: No echocardiographic evidence of inducible ischemia but patient only exercised 3 minutes and 6 seconds on Bruce protocol  6/18: Normal EF, aortic sclerosis, dilated PA, mild pulmonary hypertension    ______________________________________________________________________    Other past medical history, social history, family history, medications, allergies and problem list reviewed in the medical record.    Current cardiac medications include:  Furosemide 40 mg as needed  Enalapril 5 mg daily  Rosuvastatin 40 mg daily  Jardiance 10 mg once daily      Objective:     There were no vitals taken for this visit.    Wt Readings from Last 6 Encounters:   01/29/23 76.2 kg (168 lb)   12/26/22 77.6 kg (171 lb)   11/04/22 73.3 kg (161 lb 9.6 oz)   10/09/22 74.5 kg (164 lb 4.8 oz)   09/16/22 74.8 kg (165 lb)   08/19/22 75.8 kg (167 lb)         PHYSICAL EXAMINATION:   GENERAL:  Alert, NAD  CARDIOVASCULAR:  Regular rate and rhythm, normal S1/S2, no murmurs, rubs, or gallops.  1+ minimally pitting b/l LE edema.  RESPIRATORY:  Clear to auscultation bilaterally.  No wheezes, crackles, or rhonchi. Normal work of breathing.    ______________________________________________________________________    EKG 08/16/22 shows sinus rhythm, normal axis,prolonged QTc at ,  and no diagnostic ischemic changes. Since last tracing 02/17/22 rate has decreased by 10bpm. QTc increased by 21ms      Recent CV pertinent labs:  Lab Results   Component Value Date    LDL Calculated 81 08/19/2022    Non-HDL Cholesterol 97 08/19/2022    HDL 99 (H) 08/19/2022    PRO-BNP 225.0 01/29/2023    Creatinine 1.19 (H) 01/29/2023    Creatinine 0.8 10/04/2018    Creatinine Whole Blood, POC 0.7 05/16/2016    Potassium 3.6 01/29/2023    Potassium 3.8 10/04/2018    BUN 24 (H) 01/29/2023    BUN 16.00 10/04/2018    TSH 1.991 08/19/2022

## 2023-02-16 DIAGNOSIS — I10 Essential (primary) hypertension: Secondary | ICD-10-CM | POA: Insufficient documentation

## 2023-02-16 NOTE — Unmapped (Signed)
Assessment and Plan:   Carol Caldwell is a 67 y.o. female with a history of diabetes, psoriatic arthritis and hyperlipidemia who presents in clinic today for follow-up.     1.  Lower extremity edema, metabolic disturbance   Reviewed TTE 02/2022 showing normal EF, unable to assess diastolic function on account of mitral annular calcification.  Spironolactone and Jardiance were held at June hospitalization for AKI, electrolyte abnormalities 2/2 diuretic use in setting of GI illness.   Noted weight gain and edema at September visit with Dr. Zenaida Deed.   Restarted jardiance , continued lasix prn ( typically q3-4 days recently)  but no improvement in edema until she added back spironolactone.   proBNP last month 225    She is edematous today but says she has been on her feet a lot. Otherwise fairly well controled on current regimen. Tentatively continue current regimen. Also reviewed low Na intake, elevation, and compression.   Recheck chem panel,proBNP, Mg today.     Reminded her to hold jardiance in setting of  poor po intake.         2.  Hyperlipidemia  Reviewed labs 08/2022, lipids well-controlled with non-HDL less than 100.    Continue rosuvastatin at current dose.    3.  Hypertension  Increased enalapril last visit and pt reports resuming spironolactone.   Blood pressure above goal in the office but she reports not taking any meds this AM yet.  home readings a little lower but still just above goal.   Repeat chem panel today and adjust regimen accordingly.   Pt agrees with plan.     4. Newly appreciated murmur   Noted today. 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  Pt feels well.no s/s progressive valve disease otherwise. She is mildly tachy and has had mild anemia so will also check CBC again today.   Recommend repeat TTE - pt agrees.     5. Vitamin d deficiency, anemia    Vit d 10.9 last month. Chronic anemia - stable the last 4 months.   Will send labs to her PCP and reminded pt to follow up with them to discuss further evaluation and treatment. .         Orders Placed This Encounter   Procedures    Basic Metabolic Panel    Magnesium Level    Hemoglobin A1c    TSH    CBC    Iron Panel    Ferritin    Echocardiogram W Colorflow Spectral Doppler       Requested Prescriptions      No prescriptions requested or ordered in this encounter         Return for labs today; needs echo; f/u to be determined .          Subjective:   PCP: Elza Rafter  Patient: Carol Caldwell  DOB: [birth year]    Reason for visit:  Edema, HTN   HPI: Carol Caldwell is a 67 y.o. female with a history of diabetes, HLD, psoriatic arthritis who presents in clinic today for follow-up.   At her visit with me last month, we increased her enalapril for better blood pressure control.  She is tolerating it well, patient says she feels good.  Hasn't had any meds yet today, though.  Also resumed spironolactone at some point.   Home BPs the day after last visit was 110/90; but has been running 130-140s systolic since then.  Takes lasix every 3-4 days. Needs refill.     Denies any chest pain,shortness of breath, PND, orthopnea, presyncope/syncope diarrhea, constipation, palpitations. Edema is intermittent - better since restarting spironolactone but was on her feet a lot this weekend so she was more edematous. Does improve over night.     She tries to do low salt, no pork, minimal fried food bit does admit to eating a lot of crackers and cheese.        ______________________________________________________________________    Pertinent Medical History, Cardiovascular History & Procedures:    Pertinent PMH:  Diabetes  Psoriatic arthritis  Vitamin D deficiency  Hypertension  Hyperlipidemia    Cath / PCI:  None     CV Surgery:   None     EP Procedures and Devices:  None     Non-Invasive Evaluation(s):  ZIO monitor November' 23: Underlying sinus average rate 95 bpm; rare PACs 5 episodes of SVT longest lasting 10 beats at 143 bpm, rare PVCs, no wide-complex tachycardia; symptoms were associated with sinus rhythm, PACs, PVCs.  TTE November' 23: Mildly increased wall thickness, EF 55% sign; trivial MR and mild mitral annular calcification  Myocardial perfusion study 7/19: Equivocal due to marked adjacent gut activity overlying inferolateral segments.  No significant ischemia is appreciated involving any other segments  Stress echocardiogram 1/19: No echocardiographic evidence of inducible ischemia but patient only exercised 3 minutes and 6 seconds on Bruce protocol  6/18: Normal EF, aortic sclerosis, dilated PA, mild pulmonary hypertension    ______________________________________________________________________    Other past medical history, social history, family history, medications, allergies and problem list reviewed in the medical record.    Current cardiac medications include:  Furosemide 40 mg as needed -currently every 3-4 days  Enalapril 5 mg BID   Rosuvastatin 40 mg daily  Jardiance 10 mg once daily  Spironolactone 25mg  daily   New today: slow mag 1 tablet BID       Objective:     BP 138/60  - Pulse 98  - Ht 154.9 cm (5' 1)  - Wt 77.9 kg (171 lb 12.8 oz)  - SpO2 99%  - BMI 32.46 kg/m??     Wt Readings from Last 6 Encounters:   02/17/23 77.9 kg (171 lb 12.8 oz)   01/29/23 76.2 kg (168 lb)   12/26/22 77.6 kg (171 lb)   11/04/22 73.3 kg (161 lb 9.6 oz)   10/09/22 74.5 kg (164 lb 4.8 oz)   09/16/22 74.8 kg (165 lb)         PHYSICAL EXAMINATION:   GENERAL:  Alert, NAD  CARDIOVASCULAR:  Regular rate and rhythm, normal S1/S2, 2/6 holosytolic  murmurs, rubs, or gallops.  1+ minimally pitting b/l LE edema.  RESPIRATORY:  Clear to auscultation bilaterally.  No wheezes, crackles, or rhonchi. Normal work of breathing.    ______________________________________________________________________    EKG 08/16/22 shows sinus rhythm, normal axis,prolonged QTc at ,  and no diagnostic ischemic changes. Since last tracing 02/17/22 rate has decreased by 10bpm. QTc increased by 21ms      Recent CV pertinent labs:  Lab Results   Component Value Date    LDL Calculated 81 08/19/2022    Non-HDL Cholesterol 97 08/19/2022    HDL 99 (H) 08/19/2022    PRO-BNP 225.0 01/29/2023    Creatinine 1.12 02/17/2023    Creatinine 0.8 10/04/2018    Creatinine Whole Blood, POC 0.7 05/16/2016    Potassium 3.4 (L) 02/17/2023    Potassium 3.8 10/04/2018  BUN 14 02/17/2023    BUN 16.00 10/04/2018    TSH 3.160 02/17/2023

## 2023-02-17 ENCOUNTER — Ambulatory Visit: Admit: 2023-02-17 | Discharge: 2023-02-18 | Payer: MEDICARE

## 2023-02-17 DIAGNOSIS — Z5181 Encounter for therapeutic drug level monitoring: Principal | ICD-10-CM

## 2023-02-17 DIAGNOSIS — D649 Anemia, unspecified: Principal | ICD-10-CM

## 2023-02-17 DIAGNOSIS — R011 Cardiac murmur, unspecified: Principal | ICD-10-CM

## 2023-02-17 DIAGNOSIS — R000 Tachycardia, unspecified: Principal | ICD-10-CM

## 2023-02-17 DIAGNOSIS — I10 Essential (primary) hypertension: Principal | ICD-10-CM

## 2023-02-17 DIAGNOSIS — E118 Type 2 diabetes mellitus with unspecified complications: Principal | ICD-10-CM

## 2023-02-17 LAB — FERRITIN: FERRITIN: 114.6 ng/mL

## 2023-02-17 LAB — BASIC METABOLIC PANEL
ANION GAP: 11 mmol/L
BLOOD UREA NITROGEN: 14 mg/dL (ref 5–26)
BUN / CREAT RATIO: 13 (ref 12–25)
CALCIUM: 8.8 mg/dL (ref 8.5–10.5)
CHLORIDE: 102 mmol/L (ref 95–110)
CO2: 26.8 mmol/L (ref 21.0–31.0)
CREATININE: 1.12 mg/dL (ref 0.50–1.50)
EGFR CKD-EPI (2021) FEMALE: 54 mL/min/{1.73_m2} — ABNORMAL LOW (ref >=60)
GLUCOSE RANDOM: 179 mg/dL — ABNORMAL HIGH (ref 60–100)
POTASSIUM: 3.4 mmol/L — ABNORMAL LOW (ref 3.5–5.1)
SODIUM: 140 mmol/L (ref 136–145)

## 2023-02-17 LAB — CBC
HEMATOCRIT: 30.5 % — ABNORMAL LOW (ref 35.0–45.0)
HEMOGLOBIN: 10 g/dL — ABNORMAL LOW (ref 11.3–15.5)
MEAN CORPUSCULAR HEMOGLOBIN CONC: 32.9 g/dL (ref 31.0–36.0)
MEAN CORPUSCULAR HEMOGLOBIN: 32.5 pg — ABNORMAL HIGH (ref 26.0–32.0)
MEAN CORPUSCULAR VOLUME: 99 fL — ABNORMAL HIGH (ref 89.0–97.0)
MEAN PLATELET VOLUME: 6.2 fL — ABNORMAL LOW (ref 6.5–8.9)
PLATELET COUNT: 246 10*9/L (ref 140–440)
RED BLOOD CELL COUNT: 3.08 10*12/L — ABNORMAL LOW (ref 3.80–5.10)
RED CELL DISTRIBUTION WIDTH: 15.7 % — ABNORMAL HIGH (ref 11.0–15.0)
WBC ADJUSTED: 3.4 10*9/L — ABNORMAL LOW (ref 3.8–10.8)

## 2023-02-17 LAB — TSH: THYROID STIMULATING HORMONE: 3.16 u[IU]/mL (ref 0.010–4.000)

## 2023-02-17 LAB — IRON PANEL
IRON SATURATION: 35 % (ref 20–55)
IRON: 100 ug/dL
TOTAL IRON BINDING CAPACITY: 282 ug/dL (ref 250–425)

## 2023-02-17 LAB — MAGNESIUM: MAGNESIUM: 1.2 mg/dL — ABNORMAL LOW (ref 1.6–2.6)

## 2023-02-17 LAB — HEMOGLOBIN A1C
ESTIMATED AVERAGE GLUCOSE: 166 mg/dL
HEMOGLOBIN A1C: 7.4 % — ABNORMAL HIGH (ref 4.9–6.1)

## 2023-02-18 NOTE — Unmapped (Signed)
Called Ms. Carol Caldwell and informed her of [name]'s message on her lab draw:    Will you let her know:   1) magnesium level is low - I'd like her to get Slow -Mag over the counter and take 1 tablet twice daily. We may need to increase this but I want to make sure she tolerates it - let us know if diarrhea occurs. We should repeat labs in 7-10 days.   She voiced understanding and lab appointment was made for 11/18 as she needed the appt on a Monday.     2) she remains anemic and needs to be evaluated by her PCP. Was she able to get an appt soon? Iron and ferritin are fine.   She states she has not heard back from her PCP office yet as she called and left a message yesterday. She states she will call them again today. She said her PCP did start her on once a week vitamin D though.    3) Thyroid is fine   4) Hgb A1c is a bit higher than last time at 7.4%

## 2023-02-26 ENCOUNTER — Ambulatory Visit: Admit: 2023-02-26 | Discharge: 2023-02-27 | Payer: MEDICARE

## 2023-03-02 ENCOUNTER — Ambulatory Visit: Admit: 2023-03-02 | Payer: MEDICARE

## 2023-03-02 NOTE — Unmapped (Signed)
I spoke with Ms.Bermudes to inform her that as per Dierdre Harness PA-C, there are no worrisome changes on the echocardiogram. Patient verbalized understanding and no further questions at this time.

## 2023-03-02 NOTE — Unmapped (Signed)
-----   Message from Dierdre Harness, [place] sent at 03/02/2023  2:56 PM EST -----  Would you let this lovely patient know there were no worrisome changes on her echo compared with last year, which is good news.

## 2023-03-12 NOTE — Unmapped (Signed)
[facility] Delivery Pharm[facility] Refill Coordination Note    Specialty Medication(s) to be Shipped:   Inflammatory Disorders: Stelara    Other medication(s) to be shipped: No additional medications requested for fill at this time     KATHRYNNE EKBLAD, DOB: 17-Jul-1955  Phone: [phone] (home)       All above HIPAA information was verified with patient.     Was a Nurse, learning disability used for this call? No    Completed refill call assessment today to schedule patient's medication shipment from the [facility]  [phone]).  All relevant notes have been reviewed.     Specialty medication(s) and dose(s) confirmed: Regimen is correct and unchanged.   Changes to medications: Kandise reports no changes at this time.  Changes to insurance: No  New side effects reported not previously addressed with a pharmacist or physician: None reported  Questions for the pharmacist: No    Confirmed patient received a Conservation officer, historic buildings and a Surveyor, mining with first shipment. The patient will receive a drug information handout for each medication shipped and additional FDA Medication Guides as required.       DISEASE/MEDICATION-SPECIFIC INFORMATION        For patients on injectable medications: Patient currently has 0 doses left.  Next injection is scheduled for 12/5.    SPECIALTY MEDICATION ADHERENCE     Medication Adherence    Patient reported X missed doses in the last month: 0  Specialty Medication: STELARA 45 mg/0.5 mL Syrg syringe (ustekinumab)  Patient is on additional specialty medications: No              Were doses missed due to medication being on hold? No     STELARA 45 mg/0.5 mL Syrg syringe (ustekinumab): 0 doses of medicine on hand       REFERRAL TO PHARMACIST     Referral to the pharmacist: Not needed      [facility]     Shipping address confirmed in Epic.       Delivery Scheduled: Yes, Expected medication delivery date: 03/16/23.     Medication will be delivered via Same Day Courier to the prescription address in Epic WAM.    [facility]  Specialty Technician

## 2023-03-16 MED FILL — STELARA 45 MG/0.5 ML SUBCUTANEOUS SYRINGE: SUBCUTANEOUS | 56 days supply | Qty: 0.5 | Fill #2

## 2023-05-14 NOTE — Unmapped (Signed)
[facility] and [facility] Delivery Pharm[facility] Refill Coordination Note    Specialty Medication(s) to be Shipped:   Inflammatory Disorders: Stelara    Other medication(s) to be shipped: No additional medications requested for fill at this time     Carol Caldwell, DOB: May 21, 1955  Phone: [phone] (home)       All above HIPAA information was verified with patient.     Was a Nurse, learning disability used for this call? No    Completed refill call assessment today to schedule patient's medication shipment from the [facility] Delivery [facility]  [phone]).  All relevant notes have been reviewed.     Specialty medication(s) and dose(s) confirmed: Regimen is correct and unchanged.   Changes to medications: Carol Caldwell reports no changes at this time.  Changes to insurance: No  New side effects reported not previously addressed with a pharmacist or physician: None reported  Questions for the pharmacist: No    Confirmed patient received a Conservation officer, historic buildings and a Surveyor, mining with first shipment. The patient will receive a drug information handout for each medication shipped and additional FDA Medication Guides as required.       DISEASE/MEDICATION-SPECIFIC INFORMATION        For patients on injectable medications: Patient currently has 0 doses left.  Next injection is scheduled for 1/31.    SPECIALTY MEDICATION ADHERENCE     Medication Adherence    Patient reported X missed doses in the last month: 0  Specialty Medication: STELARA 45 mg/0.5 mL Syrg syringe (ustekinumab)  Patient is on additional specialty medications: No              Were doses missed due to medication being on hold? No     STELARA 45 mg/0.5 mL Syrg syringe (ustekinumab): 0 doses of medicine on hand       REFERRAL TO PHARMACIST     Referral to the pharmacist: Not needed      [facility]     Shipping address confirmed in Epic.       Delivery Scheduled: Yes, Expected medication delivery date: 05/15/23.     Medication will be delivered via Same Day Courier to the prescription address in Epic WAM.    [facility]  Specialty Technician

## 2023-05-15 MED FILL — STELARA 45 MG/0.5 ML SUBCUTANEOUS SYRINGE: SUBCUTANEOUS | 56 days supply | Qty: 0.5 | Fill #3

## 2023-05-29 DIAGNOSIS — L4050 Arthropathic psoriasis, unspecified: Principal | ICD-10-CM

## 2023-07-02 NOTE — Unmapped (Signed)
 [facility] Special[facility] and Home Delivery Pharmacy Refill Coordination Note    Specialty Medication(s) to be Shipped:   Inflammatory Disorders: Stelara    Other medication(s) to be shipped: No additional medications requested for fill at this time     Carol Caldwell, DOB: 1955/06/21  Phone: [phone] (home)       All above HIPAA information was verified with patient.     Was a Nurse, learning disability used for this call? No    Completed refill call assessment today to schedule patient's medication shipment from the [facility] and [facility] Delivery Pharmacy  [phone]).  All relevant notes have been reviewed.     Specialty medication(s) and dose(s) confirmed: Regimen is correct and unchanged.   Changes to medications: Lakeita reports no changes at this time.  Changes to insurance: No  New side effects reported not previously addressed with a pharmacist or physician: None reported  Questions for the pharmacist: No    Confirmed patient received a Conservation officer, historic buildings and a Surveyor, mining with first shipment. The patient will receive a drug information handout for each medication shipped and additional FDA Medication Guides as required.       DISEASE/MEDICATION-SPECIFIC INFORMATION        For patients on injectable medications: Patient currently has 0 doses left.  Next injection is scheduled for asap.    SPECIALTY MEDICATION ADHERENCE     Medication Adherence    Patient reported X missed doses in the last month: 0  Specialty Medication: STELARA 45 mg/0.5 mL Syrg syringe (ustekinumab)  Patient is on additional specialty medications: No              Were doses missed due to medication being on hold? No    STELARA 45 mg/0.5 mL Syrg syringe (ustekinumab)  : 0 doses of medicine on hand       REFERRAL TO PHARMACIST     Referral to the pharmacist: Not needed      SHIPPING     Shipping address confirmed in Epic.     Cost and Payment: Patient has a $0 copay, payment information is not required.    Delivery Scheduled: Yes, Expected medication delivery date: 3/24.     Medication will be delivered via Same Day Courier to the prescription address in Epic WAM.    [facility]  Specialty Technician

## 2023-07-06 MED FILL — STELARA 45 MG/0.5 ML SUBCUTANEOUS SYRINGE: SUBCUTANEOUS | 56 days supply | Qty: 0.5 | Fill #4

## 2023-07-24 ENCOUNTER — Other Ambulatory Visit: Payer: Self-pay | Admitting: Nurse Practitioner

## 2023-07-24 DIAGNOSIS — Z1231 Encounter for screening mammogram for malignant neoplasm of breast: Secondary | ICD-10-CM

## 2023-07-29 ENCOUNTER — Encounter

## 2023-08-05 NOTE — Unmapped (Signed)
 Created in error [facility] -Next refill call is 05.12.2025

## 2023-08-18 NOTE — Unmapped (Signed)
 [facility] Special[facility] and Home Delivery Pharmacy Refill Coordination Note    Specialty Medication(s) to be Shipped:   Inflammatory Disorders: Stelara     Other medication(s) to be shipped: No additional medications requested for fill at this time     Carol Caldwell, DOB: 09-10-1955  Phone: [phone] (home)       All above HIPAA information was verified with patient.     Was a Nurse, learning disability used for this call? No    Completed refill call assessment today to schedule patient's medication shipment from the [facility] and Home Delivery Pharmacy  [phone]).  All relevant notes have been reviewed.     Specialty medication(s) and dose(s) confirmed: Regimen is correct and unchanged.   Changes to medications: Wilhemenia reports no changes at this time.  Changes to insurance: No  New side effects reported not previously addressed with a pharmacist or physician: None reported  Questions for the pharmacist: No    Confirmed patient received a Conservation officer, historic buildings and a Surveyor, mining with first shipment. The patient will receive a drug information handout for each medication shipped and additional FDA Medication Guides as required.       DISEASE/MEDICATION-SPECIFIC INFORMATION        For patients on injectable medications: Patient currently has 0 doses left.  Next injection is scheduled for 08/21/23.    SPECIALTY MEDICATION ADHERENCE     Medication Adherence    Patient reported X missed doses in the last month: 0  Specialty Medication: STELARA  45 mg/0.5 mL Syrg syringe (ustekinumab )  Patient is on additional specialty medications: No  Patient is on more than two specialty medications: No  Any gaps in refill history greater than 2 weeks in the last 3 months: no  Demonstrates understanding of importance of adherence: yes              Were doses missed due to medication being on hold? No    STELARA  45  mg/ml: 0 days of medicine on hand       REFERRAL TO PHARMACIST     Referral to the pharmacist: Not needed      [facility] Shipping address confirmed in Epic.     Cost and Payment: Patient has a $0 copay, payment information is not required.    Delivery Scheduled: Yes, Expected medication delivery date: 08/20/23.     Medication will be delivered via Same Day Courier to the prescription address in Epic WAM.    Stephen Ehrlich   [facility]  Specialty Technician

## 2023-08-20 MED FILL — STELARA 45 MG/0.5 ML SUBCUTANEOUS SYRINGE: SUBCUTANEOUS | 56 days supply | Qty: 0.5 | Fill #5

## 2023-09-23 ENCOUNTER — Emergency Department
Admit: 2023-09-23 | Discharge: 2023-09-23 | Disposition: A | Discharge status: Home / Self Care | Payer: Medicare (Managed Care)

## 2023-09-23 DIAGNOSIS — L089 Local infection of the skin and subcutaneous tissue, unspecified: Principal | ICD-10-CM

## 2023-09-23 MED ORDER — CLINDAMYCIN HCL 300 MG CAPSULE
ORAL_CAPSULE | Freq: Three times a day (TID) | ORAL | 0 refills | 10.00000 days | Status: CP
Start: 2023-09-23 — End: ?

## 2023-09-23 MED ORDER — MUPIROCIN 2 % TOPICAL OINTMENT
Freq: Three times a day (TID) | TOPICAL | 1 refills | 0.00000 days | Status: CP
Start: 2023-09-23 — End: 2023-09-30

## 2023-09-23 NOTE — Unmapped (Signed)
 Pt stating she was sent by her PCP to have her right 1st toe evaluated. Pt stating she has a wound on the bottom of the toe and the doctor wanted to rule out a bone infection. Pt stating she had some clear drainage when she squeezed it two days ago. Hx of diabetes

## 2023-10-01 ENCOUNTER — Ambulatory Visit
Admission: RE | Admit: 2023-10-01 | Discharge: 2023-10-01 | Disposition: A | Discharge status: Home / Self Care | Source: Ambulatory Visit | Attending: Nurse Practitioner | Admitting: Nurse Practitioner

## 2023-10-01 DIAGNOSIS — Z1231 Encounter for screening mammogram for malignant neoplasm of breast: Secondary | ICD-10-CM | POA: Insufficient documentation

## 2023-10-01 NOTE — Unmapped (Signed)
 [facility]  Emergency Department Provider Note     ED Clinical Impression     Final diagnoses:   Infection of toe (Primary)      Impression, Medical Decision Making, ED Course     HPI/impression/plan of care: 68 y.o. female with PMH most significant for diabetes who presents with concern for right great toe infection.  Was seen by PCP and sent here to rule out osteomyelitis.  Clear drainage for 2 days.  She denies fever, denies subjective fevers she does endorse painful ambulation and states that there has been an area of concern present for several weeks.  On exam she is nontoxic-appearing, in no acute distress, there is a large callused area on the plantar aspect of the first great toe that appears to be draining some purulent drainage.  Her x-ray is negative for acute osseous process at this time osteomyelitis is unlikely.  Plan to discharge with oral antibiotics and prompt referral to podiatry to evaluate further.          Diagnostic workup as below.     Orders Placed This Encounter   Procedures    XR Toe Right FIRST    Ambulatory referral to Podiatry                 History     Chief Complaint  Chief Complaint   Patient presents with    Toe Swelling         Past Medical History[1]    Past Surgical History[2]    No current facility-administered medications for this encounter.    Current Outpatient Medications:     cholecalciferol , vitamin D3-10 mcg, 400 unit,, 10 mcg (400 unit) cap, Take 1 capsule (10 mcg total) by mouth daily., Disp: , Rfl:     clindamycin  (CLEOCIN ) 300 MG capsule, Take 1 capsule (300 mg total) by mouth Three (3) times a day., Disp: 30 capsule, Rfl: 0    diclofenac  sodium (VOLTAREN ) 1 % gel, Apply 2 g topically four (4) times a day as needed for pain., Disp: 450 g, Rfl: 11    DULoxetine  (CYMBALTA ) 60 MG capsule, Take 1 capsule (60 mg total) by mouth daily., Disp: , Rfl:     empagliflozin (JARDIANCE) 10 mg tablet, Take 1 tablet (10 mg total) by mouth daily., Disp: , Rfl:     empty container Misc, Use as directed, Disp: 1 each, Rfl: 2    enalapril  (VASOTEC ) 5 MG tablet, Take 1 tablet (5 mg total) by mouth daily., Disp: 90 tablet, Rfl: 1    furosemide  (LASIX ) 40 MG tablet, Take 1 tablet by mouth daily as needed for swelling. Weigh self daily, take 1 tablet if >3 lb weight gain in 24 hours or >5 lbs in 1 week, Disp: , Rfl:     gabapentin  (NEURONTIN ) 300 MG capsule, Take 2 capsules (600 mg total) by mouth Three (3) times a day., Disp: , Rfl:     rosuvastatin  (CRESTOR ) 40 MG tablet, Take 1 tablet (40 mg total) by mouth daily., Disp: 90 tablet, Rfl: 3    SITagliptin phosphate (JANUVIA) 50 MG tablet, Take 1 tablet (50 mg total) by mouth daily., Disp: , Rfl:     spironolactone  (ALDACTONE ) 25 MG tablet, Take 1 tablet (25 mg total) by mouth daily., Disp: , Rfl:     ustekinumab  (STELARA ) 45 mg/0.5 mL Syrg syringe, Inject the contents of 1 syringe (45 mg total) under the skin every 8 weeks., Disp: 2 mL, Rfl: 3  vit C,E-Zn-coppr-lutein-zeaxan (PRESERVISION AREDS-2) 250-90-40-1 mg cap, Take by mouth daily., Disp: , Rfl:     Allergies  Patient has no known allergies.    Family History  Family History[3]    Social History  Short Social History[4]     Physical Exam     VITAL SIGNS:      Vitals:    09/23/23 1103 09/23/23 1448   BP: 148/77 171/80   Pulse: 98 73   Resp: 20 18   Temp: 36.7 ??C (98.1 ??F) 36.5 ??C (97.7 ??F)   TempSrc: Oral Oral   SpO2: 100% 100%   Weight: 79.1 kg (174 lb 4.4 oz)        Constitutional: Alert and oriented. No acute distress.  Eyes: Conjunctivae are normal.  HEENT: Normocephalic and atraumatic. Conjunctivae clear. No congestion. Moist mucous membranes.   Cardiovascular: Rate as above, regular rhythm. Normal and symmetric distal pulses. Brisk capillary refill. Normal skin turgor.  Respiratory: Normal respiratory effort. Breath sounds are normal. There are no wheezing or crackles heard.  Gastrointestinal: Soft, non-distended, non-tender.  Genitourinary: Deferred.  Musculoskeletal: Non-tender with normal range of motion in all extremities.  Neurologic: Normal speech and language. No gross focal neurologic deficits are appreciated. Patient is moving all extremities equally, face is symmetric at rest and with speech.  Skin: Skin is warm, dry and intact. No rash noted.  Psychiatric: Mood and affect are normal. Speech and behavior are normal.     Radiology     XR Toe Right FIRST   Final Result   No acute osseous abnormality. 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          Pertinent labs & imaging results that were available during my care of the patient were independently interpreted by me and considered in my medical decision making (see chart for details).    Portions of this record have been created using Scientist, clinical (histocompatibility and immunogenetics). Dictation errors have been sought, but may not have been identified and corrected.           [1]   Past Medical History:  Diagnosis Date    Diabetes mellitus        Psoriasis     Psoriatic arthritis       [2]   Past Surgical History:  Procedure Laterality Date    BREAST BIOPSY Left     HYSTERECTOMY      NOne     [3]   Family History  Problem Relation Age of Onset    Diabetes Mother     Cancer Sister     Melanoma Neg Hx     Basal cell carcinoma Neg Hx     Squamous cell carcinoma Neg Hx    [4]   Social History  Tobacco Use    Smoking status: Never    Smokeless tobacco: Never   Vaping Use    Vaping status: Never Used   Substance Use Topics    Alcohol use: Not Currently     Comment: socially    Drug use: No        Marsa Mabel Barrio, FNP  10/01/23 [phone]

## 2023-10-07 NOTE — Unmapped (Signed)
 [facility] Clinical Assessment & Refill Coordination Note    -Patient feels like Stelara  is working for 3-4 weeks and wears off after that.  Patient complains of lower back pain , routed message to provider  -Patient still has toe pain at night with itching, patient will visit her PCP on Friday 6/27 to discuss, patient has completed 10 days of antibiotics with limited benefits   -Insurance required switch to biosimilar [place] or [facility]  Requested new RX from MD for PA renewal    Carol Caldwell, DOB: 09-12-1955  Phone: [phone] (home)     All above HIPAA information was verified with patient.     Was a Nurse, learning disability used for this call? No    Specialty Medication(s):   Inflammatory Disorders: Stelara      Current Medications[1]     Changes to medications: [name] reports no changes at this time.  Patient did not want to review med list    Medication list has been reviewed and updated in Epic: Patient did not want to review med list    Allergies[2]    Changes to allergies: No    Allergies have been reviewed and updated in Epic: Yes    SPECIALTY MEDICATION ADHERENCE         Medication Adherence    Patient reported X missed doses in the last month: 0  Specialty Medication: Stelara   Patient is on additional specialty medications: No  Informant: patient          Specialty medication(s) dose(s) confirmed: Regimen is correct and unchanged.     Are there any concerns with adherence? No    Adherence counseling provided? Not needed    CLINICAL MANAGEMENT AND INTERVENTION      Clinical Benefit Assessment:    Do you feel the medicine is effective or helping your condition? Patient declined to answer    Clinical Benefit counseling provided? consulted provider regarding clinical benefit concerns    Adverse Effects Assessment:    Are you experiencing any side effects? No    Are you experiencing difficulty administering your medicine? No    Quality of Life Assessment:    Quality of Life Rheumatology  Oncology  Dermatology  Cystic Fibrosis          How many days over the past month did your PsA  keep you from your normal activities? For example, brushing your teeth or getting up in the morning. Every day    Have you discussed this with your provider? No - pharmacist will consult provider, see clinical intervention documentation    Acute Infection Status:    Acute infections noted within Epic:  No active infections    Patient reported infection: possible toe infection, patient was seen in ER and will visit PCP 6/27 and has appt with podiatrist in Sept     Therapy Appropriateness:    Is therapy appropriate based on current medication list, adverse reactions, adherence, clinical benefit and progress toward achieving therapeutic goals? Yes, therapy is appropriate and should be continued     Clinical Intervention:    Was an intervention completed as part of this clinical assessment? No    DISEASE/MEDICATION-SPECIFIC INFORMATION      For patients on injectable medications: Patient currently has 0 doses left.  Next injection is scheduled for 7/4.    Chronic Inflammatory Diseases: Have you experienced any flares in the last month? Unclear, will have clinic reach out    PATIENT SPECIFIC NEEDS  Does the patient have any physical, cognitive, or cultural barriers? No    Is the patient high risk? No    Does the patient require physician intervention or other additional services (i.e., nutrition, smoking cessation, social work)? No    Does the patient have an additional or emergency contact listed in their chart? Yes    SOCIAL DETERMINANTS OF HEALTH     At the [facility], we have learned that life circumstances - like trouble affording food, housing, utilities, or transportation can affect the health of many of our patients.   That is why we wanted to ask: are you currently experiencing any life circumstances that are negatively impacting your health and/or quality of life? Patient declined to answer    Social Drivers of Health     Food Insecurity: Not on file   Tobacco Use: Low Risk  (09/23/2023)    Patient History     Smoking Tobacco Use: Never     Smokeless Tobacco Use: Never     Passive Exposure: Not on file   Transportation Needs: Not on file   Alcohol Use: Not on file   Housing: Not on file   Physical Activity: Not on file   Utilities: Not on file   Stress: Not on file   Interpersonal Safety: Not on file   Substance Use: Not on file (02/16/2023)   Intimate Partner Violence: Not on file   Social Connections: Not on file   Financial Resource Strain: Not on file   Health Literacy: Not on file   Internet Connectivity: Not on file       Would you be willing to receive help with any of the needs that you have identified today? Not applicable       SHIPPING     Specialty Medication(s) to be Shipped:   Inflammatory Disorders: Stelara     Other medication(s) to be shipped: No additional medications requested for fill at this time     Changes to insurance: No    Cost and Payment: Patient has a $0 copay, payment information is not required.    Delivery Scheduled: Yes, Expected medication delivery date: 7/1.     Medication will be delivered via Same Day Courier to the confirmed prescription address in [facility].    The patient will receive a drug information handout for each medication shipped and additional FDA Medication Guides as required.  Verified that patient has previously received a Conservation officer, historic buildings and a Surveyor, mining.    The patient or caregiver noted above participated in the development of this care plan and knows that they can request review of or adjustments to the care plan at any time.      All of the patient's questions and concerns have been addressed.    [name], PharmD   [facility] Specialty Pharmacist       [1]   Current Outpatient Medications   Medication Sig Dispense Refill    cholecalciferol , vitamin D3-10 mcg, 400 unit,, 10 mcg (400 unit) cap Take 1 capsule (10 mcg total) by mouth daily.      clindamycin  (CLEOCIN ) 300 MG capsule Take 1 capsule (300 mg total) by mouth Three (3) times a day. 30 capsule 0    diclofenac  sodium (VOLTAREN ) 1 % gel Apply 2 g topically four (4) times a day as needed for pain. 450 g 11    DULoxetine  (CYMBALTA ) 60 MG capsule Take 1 capsule (60 mg total)  by mouth daily.      empagliflozin (JARDIANCE) 10 mg tablet Take 1 tablet (10 mg total) by mouth daily.      empty container Misc Use as directed 1 each 2    enalapril  (VASOTEC ) 5 MG tablet Take 1 tablet (5 mg total) by mouth daily. 90 tablet 1    furosemide  (LASIX ) 40 MG tablet Take 1 tablet by mouth daily as needed for swelling. Weigh self daily, take 1 tablet if >3 lb weight gain in 24 hours or >5 lbs in 1 week      gabapentin  (NEURONTIN ) 300 MG capsule Take 2 capsules (600 mg total) by mouth Three (3) times a day.      rosuvastatin  (CRESTOR ) 40 MG tablet Take 1 tablet (40 mg total) by mouth daily. 90 tablet 3    SITagliptin phosphate (JANUVIA) 50 MG tablet Take 1 tablet (50 mg total) by mouth daily.      spironolactone  (ALDACTONE ) 25 MG tablet Take 1 tablet (25 mg total) by mouth daily.      ustekinumab  (STELARA ) 45 mg/0.5 mL Syrg syringe Inject the contents of 1 syringe (45 mg total) under the skin every 8 weeks. 2 mL 3    vit C,E-Zn-coppr-lutein-zeaxan (PRESERVISION AREDS-2) 250-90-40-1 mg cap Take by mouth daily.       No current facility-administered medications for this visit.   [2] No Known Allergies

## 2023-10-09 NOTE — Unmapped (Signed)
 Left vc msg :July 10th if that works better. (I heard from pharmacist that she feels like her medicine is wearing off too soon and she has toe pain.Carol Caldwell

## 2023-10-12 DIAGNOSIS — L4050 Arthropathic psoriasis, unspecified: Principal | ICD-10-CM

## 2023-10-12 MED ORDER — STEQEYMA 45 MG/0.5 ML SUBCUTANEOUS SYRINGE
SUBCUTANEOUS | 3 refills | 0.00000 days | Status: CP
Start: 2023-10-12 — End: ?
  Filled 2023-10-14: qty 0.5, 56d supply, fill #0

## 2023-10-12 NOTE — Unmapped (Signed)
-----   Message from Greig Moats sent at 10/12/2023  4:20 PM EDT -----  Regarding: Stelara   Good afternoon,    Pt insurance no longer covers Stelara . STEQEYMA (PA req'd);YESINTEK (PA req'd) aer now preferred. Would you like to change to one of these. If so please send us  a new Rx, Ot ius due to ship 10/13/23.    Thank you,     Greig Moats, CPhT   Specialty Pharmacy Technician   [facility]   (P): [phone]

## 2023-10-13 NOTE — Unmapped (Signed)
 [facility] Specialty Medication Onboarding    Specialty Medication: STEQEYMA 45 mg/0.5 mL Syrg (ustekinumab -stba)  Prior Authorization: Approved   Financial Assistance: No - copay  <$25  Final Copay/Day Supply: $0 / 56    Insurance Restrictions: Yes - Max 56ds    Notes to Pharmacist:   Credit Card on File: not applicable  Start Date on Rx:  10/12/23    The triage team has completed the benefits investigation and has determined that the patient is able to fill this medication at [facility]. Please contact the patient to complete the onboarding or follow up with the prescribing physician as needed.

## 2023-10-13 NOTE — Unmapped (Signed)
 [facility]    Patient Onboarding/Medication Counseling    -Switch From Stelara  to [name] due to insurance requirements    Carol Caldwell is a 68 y.o. female with PsA who I am counseling today on initiation of therapy.  I am speaking to the patient.    Was a Nurse, learning disability used for this call? No    Verified patient's date of birth / HIPAA.    Specialty medication(s) to be sent: Inflammatory Disorders: Steqemy       Non-specialty medications/supplies to be sent: n/a      Medications not needed at this time: n/a       Steqeyma (ustekinumab -stba)    The patient declined counseling on medication administration, missed dose instructions, goals of therapy, side effects and monitoring parameters, warnings and precautions, drug/food interactions, and storage, handling precautions, and disposal because they have taken the medication previously. The information in the declined sections below are for informational purposes only and was not discussed with patient.     Medication & Administration     Dosage: Inject the contents of 1 syringe (45 mg) under the skin every 8 weeks.    Lab tests required prior to treatment initiation:  Tuberculosis: Tuberculosis screening resulted in a non-reactive Quantiferon TB Gold assay.    Administration:     Artist all supplies needed for injection on a clean, flat working surface: medication syringe(s) removed from packaging, alcohol swab, sharps container, etc.  Look at the medication label - look for correct medication, correct dose, and check the expiration date  Look at the medication - the liquid in the syringe should appear clear and colorless to slightly yellow, you may see a few white particles. Air bubbles are normal.  Lay the syringe on a flat surface and allow it to warm up to room temperature for at least 30 minutes  Select injection site - you can use the front of your thigh or your belly (but not the area 2 inches around your belly button); if someone else is giving you the injection you can also use your outer upper arm in the skin covering your triceps muscle or in the buttocks  Prepare injection site - wash your hands and clean the skin at the injection site with an alcohol swab and let it air dry, do not touch the injection site again before the injection  Pull off the needle safety cap, do not remove until immediately prior to injection  Pinch the skin - with your hand not holding the syringe pinch up a fold of skin at the injection site using your forefinger and thumb  Insert the needle into the fold of skin at about a 45 degree angle - it's best to use a quick dart-like motion  Push the plunger down slowly as far as it will go until the syringe is empty, if the plunger is not fully depressed the needle shield will not extend to cover the needle when it is removed, hold the syringe in place for a full 5 seconds  Check that the syringe is empty and keep pressing down on the plunger while you pull the needle out at the same angle as inserted; after the needle is removed completely from the skin, release the plunger allowing the needle shield to activate and cover the used needle  Dispose of the used syringe immediately in your sharps disposal container, do not attempt to recap the needle prior to disposing  If you see  any blood at the injection site, press a cotton ball or gauze on the site and maintain pressure until the bleeding stops, do not rub the injection site      Adherence/Missed dose instructions:  If your injection is given more than 7-10 days after your scheduled injection date - consult your pharmacist for additional instructions on how to adjust your dosing schedule.    Goals of Therapy     Crohn's disease  Achieve remission of symptoms  Maintain remission of symptoms  Minimize long-term systemic glucocorticoid use  Prevent need for surgical procedures  Maintenance of effective psychosocial functioning    Plaque Psoriasis  Minimize areas of skin involvement (% BSA)  Avoidance of long term glucocorticoid use  Maintenance of effective psychosocial functioning    Psoriatic arthritis  Achieve remission/inactive disease or low/minimal disease activity  Maintenance of function  Minimization of systemic manifestations and comorbidities  Maintenance of effective psychosocial functioning    Ulcerative colitis  Achieve remission of symptoms  Maintain remission of symptoms  Minimize long-term systemic glucocorticoid use  Prevent need for surgical procedures  Maintenance of effective psychosocial functioning      Side Effects & Monitoring Parameters     Injection site reaction (redness, irritation, inflammation localized to the site of administration)  Signs of a common cold - minor sore throat, runny or stuffy nose, etc.  Feeling tired or weak  Headache    The following side effects should be reported to the provider:  Signs of a hypersensitivity reaction - rash; hives; itching; red, swollen, blistered, or peeling skin; wheezing; tightness in the chest or throat; difficulty breathing, swallowing, or talking; swelling of the mouth, face, lips, tongue, or throat; etc.  Reduced immune function - report signs of infection such as fever; chills; body aches; very bad sore throat; ear or sinus pain; cough; more sputum or change in color of sputum; pain with passing urine; wound that will not heal, etc.  Also at a slightly higher risk of some malignancies (mainly skin and blood cancers) due to this reduced immune function.  In the case of signs of infection - the patient should hold the next dose of Steqeyma?? and call your primary care provider to ensure adequate medical care.  Treatment may be resumed when infection is treated and patient is asymptomatic.  Changes in skin - a new growth or lump that forms; changes in shape, size, or color of a previous mole or marking  Shortness of breath or chest pain  Vaginal itching or discharge      Contraindications, Warnings, & Precautions     Have your bloodwork checked as you have been told by your prescriber  Talk with your doctor if you are pregnant, planning to become pregnant, or breastfeeding  Discuss the possible need for holding your dose(s) of Steqeyma?? when a planned procedure is scheduled with the prescriber as it may delay healing/recovery timeline       Drug/Food Interactions     Medication list reviewed in Epic. The patient was instructed to inform the care team before taking any new medications or supplements. No drug interactions identified.   If you have a latex allergy use caution when handling, the needle cap of the Steqeyma?? prefilled syringe contains a derivative of natural rubber latex  Talk with you prescriber or pharmacist before receiving any live vaccinations while taking this medication and after you stop taking it    Storage, Handling Precautions, & Disposal     Store this  medication in the refrigerator (36?? to 46??F).  Do not freeze  If needed, individual vials and prefilled syringes may be stored at room temperature up to 86??F (30??C) for a maximum single period of up to 15 days in the original carton to protect from light. Once a syringe has been stored at room temperature, do not return it to the refrigerator  Store in original packaging, protected from light  Do not shake  Dispose of used syringes/pens in a sharps disposal container    Ref: STEQEYMA-Patient-Injection-Overview-and-Dosing-Calendar.pdf           Current Medications (including OTC/herbals), Comorbidities and Allergies     Current Medications[1]    Allergies[2]    Problem List[3]    Medication list has been reviewed and updated in Epic: Yes    Allergies have been reviewed and updated in Epic: Yes    Appropriateness of Therapy     Acute infections noted within Epic:  No active infections  Patient reported infection: None    Is the medication and dose appropriate based on diagnosis, medication list, comorbidities, allergies, medical history, patient???s ability to self-administer the medication, and therapeutic goals? Yes    Prescription has been clinically reviewed: Yes      Baseline Quality of Life Assessment      How many days over the past month did your condition  keep you from your normal activities? For example, brushing your teeth or getting up in the morning. Patient declined to answer    Financial Information     Medication Assistance provided: Prior Authorization    Anticipated copay of $0 reviewed with patient. Verified delivery address.    Delivery Information     Scheduled delivery date: 7/1    Expected start date: 7/4      Medication will be delivered via Same Day Courier to the prescription address in [facility].  This shipment will not require a signature.      Explained the services we provide at [facility] and that each month we would call to set up refills.  Stressed importance of returning phone calls so that we could ensure they receive their medications in time each month.  Informed patient that we should be setting up refills 7-10 days prior to when they will run out of medication.  A pharmacist will reach out to perform a clinical assessment periodically.  Informed patient that a welcome packet, containing information about our pharmacy and other support services, a Notice of Privacy Practices, and a drug information handout will be sent.      The patient or caregiver noted above participated in the development of this care plan and knows that they can request review of or adjustments to the care plan at any time.      Patient or caregiver verbalized understanding of the above information as well as how to contact the pharmacy at [phone] option 4 with any questions/concerns.  The pharmacy is open Monday through Friday 8:30am-4:30pm.  A pharmacist is available 24/7 via pager to answer any clinical questions they may have.    Patient Specific Needs     Does the patient have any physical, cognitive, or cultural barriers? No    Does the patient have adequate living arrangements? (i.e. the ability to store and take their medication appropriately) Yes    Did you identify any home environmental safety or security hazards? No    Patient prefers to have medications discussed with  Patient  Is the patient or caregiver able to read and understand education materials at a high school level or above? Yes    Patient's primary language is  English     Is the patient high risk? No    Does the patient have an additional or emergency contact listed in their chart? Yes    SOCIAL DETERMINANTS OF HEALTH     At the [facility], we have learned that life circumstances - like trouble affording food, housing, utilities, or transportation can affect the health of many of our patients.   That is why we wanted to ask: are you currently experiencing any life circumstances that are negatively impacting your health and/or quality of life? Patient declined to answer    Social Drivers of Health     Food Insecurity: Not on file   Tobacco Use: Low Risk  (09/23/2023)    Patient History     Smoking Tobacco Use: Never     Smokeless Tobacco Use: Never     Passive Exposure: Not on file   Transportation Needs: Not on file   Alcohol Use: Not on file   Housing: Not on file   Physical Activity: Not on file   Utilities: Not on file   Stress: Not on file   Interpersonal Safety: Not on file   Substance Use: Not on file (02/16/2023)   Intimate Partner Violence: Not on file   Social Connections: Not on file   Financial Resource Strain: Not on file   Health Literacy: Not on file   Internet Connectivity: Not on file       Would you be willing to receive help with any of the needs that you have identified today? Not applicable       [name], PharmD  [facility] Specialty Pharmacist       [1]   Current Outpatient Medications   Medication Sig Dispense Refill    cholecalciferol , vitamin D3-10 mcg, 400 unit,, 10 mcg (400 unit) cap Take 1 capsule (10 mcg total) by mouth daily.      clindamycin  (CLEOCIN ) 300 MG capsule Take 1 capsule (300 mg total) by mouth Three (3) times a day. 30 capsule 0    diclofenac  sodium (VOLTAREN ) 1 % gel Apply 2 g topically four (4) times a day as needed for pain. 450 g 11    DULoxetine  (CYMBALTA ) 60 MG capsule Take 1 capsule (60 mg total) by mouth daily.      empagliflozin (JARDIANCE) 10 mg tablet Take 1 tablet (10 mg total) by mouth daily.      empty container Misc Use as directed 1 each 2    enalapril  (VASOTEC ) 5 MG tablet Take 1 tablet (5 mg total) by mouth daily. 90 tablet 1    furosemide  (LASIX ) 40 MG tablet Take 1 tablet by mouth daily as needed for swelling. Weigh self daily, take 1 tablet if >3 lb weight gain in 24 hours or >5 lbs in 1 week      gabapentin  (NEURONTIN ) 300 MG capsule Take 2 capsules (600 mg total) by mouth Three (3) times a day.      rosuvastatin  (CRESTOR ) 40 MG tablet Take 1 tablet (40 mg total) by mouth daily. 90 tablet 3    SITagliptin phosphate (JANUVIA) 50 MG tablet Take 1 tablet (50 mg total) by mouth daily.      spironolactone  (ALDACTONE ) 25 MG tablet Take 1 tablet (25 mg total) by mouth daily.  ustekinumab  (STELARA ) 45 mg/0.5 mL Syrg syringe Inject the contents of 1 syringe (45 mg total) under the skin every 8 weeks. 2 mL 3    ustekinumab -stba (STEQEYMA) 45 mg/0.5 mL Syrg Inject the contents of 1 syringe (45 mg) under the skin every 8 weeks. 1 mL 3    vit C,E-Zn-coppr-lutein-zeaxan (PRESERVISION AREDS-2) 250-90-40-1 mg cap Take by mouth daily.       No current facility-administered medications for this visit.   [2] No Known Allergies  [3]   Patient Active Problem List  Diagnosis    Controlled type 2 diabetes mellitus, without long-term current use of insulin        Psoriatic arthritis       Bilateral lower extremity edema    Syncope    Vitamin D  deficiency    Hypovolemia associated with diuresis    Hyponatremia    Hypomagnesemia Essential hypertension

## 2023-10-21 NOTE — Unmapped (Signed)
 Last clinic visit: July 2024     Accompanied by: her 68 year old young grandson.      Chief complaint: follow-up Psoriatic Arthritis (PsA)      History of Present Illness:     HPI:  Carol Caldwell is a 68 y.o. 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 We have avoided mtx/leflunomide due to alcohol use. 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     Treatment history:  - We have avoided mtx/leflunomide due to alcohol use.   - Humira  started 09/2016  - Switched to Cosentyx  150 mg q4 weeks 06/2020  - Increased to Cosentyx  300 mg q4 weeks in 09/2020  - Cosentyx  changed to Stelara  in 02/2021 due to persistent back pain    Current Treatment:  - Steqeyma  (Stelara  biosimilar) 45 mg sq q8 weeks    Interval History:  - Today's appt moved up due to pt discussed more pain in her toes with pharmacist.   - She is having pain on her R side from her toe up to her arm.   - Seen in ED for toe swelling last month. Came on gradually over 6 months. Treated with clindamycin  and mupirocin  with minimal improvement. Toe worse at night. Not worse walking on it.   - R arm having pain in her entire arm including shoulder, upper and lower arm, and in her R thumb.   - Was using diclofenac  gel but insurance is no longer covering. Now taking ibuprofen instead but told to limit use.   - Back hurts with lifting.   - She thinks Stelara  is working pretty well. Insurance asked her to switch to Steqeyma , due for next dose but has not started.    - Sometimes has AM stiffness, when happens better after she gets up and starts walking but still has aching.   - Still psoriasis on her feet that is unchanged.     Objective      Visit Vitals  BP 146/82 (BP Site: L Arm, BP Position: Sitting, BP Cuff Size: Medium)   Pulse 86   Temp 36.3 ??C (97.3 ??F) (Temporal)   Wt 76.3 kg (168 lb 3.2 oz)   BMI 31.78 kg/m??      GENERAL: The patient is well appearing, in no acute distress. Ambulates around exam room easily and climbs on exam table with mild difficulty.  SKIN: No rash.   EYES: PERRL. Sclera anicteric, conjunctiva non-injected.   ENT: mucus membranes moist.   Neck: supple, no cervical lymphadenopathy  Respiratory: Breathing non-labored, CTA bilaterally  CV: Heart rate regular, no murmurs  VASCULAR: warm and well perfused extremities. BLE mild edema.   NEURO: CN 2-12 grossly intact.   PSYCH: No depression or anxiety. Cooperative. Alert and oriented.   MUSCULOSKELETAL:   Neck: Mildly limited ROM all directions.   Bilateral shoulders, elbows, wrists, hands, fingers:  No deformity, erythema, warmth, swelling, effusion, tenderness, or limited ROM.?? Able to curl all fingers. Prayer sign negative. Except: R MCP 1, PIP 1, 1st CMC, and R wrist TTP, no synovitis. R upper and lower arm mild diffuse TTP. Bilat shoulders fwd flex limited to 90*.   Spine nontender to palpation. Except: Mild TTP lumbar and thoracic midline and scapular region. TTP bilat SI region   Bilateral knees, ankles, feet, toes: No synovitis or TTP bilat knees or ankles. MTP squeeze negative.  Except: Bilat ankles mild TTP.     Assessment/Plan:     Carol Caldwell is a 68 y.o. female with a history of Psoriatic Arthritis (PsA) characterized by skin psoriasis and sacroiliitis. Patient is maintained on Stelara  q8 weeks and is satisfied with current treatment.     Psoriasis and Psoriatic Arthritis: Currently having joint pain but no clear synovitis on exam, pain in joints but also non-joints (R arm), some of her pain may be from OA. Swelling in R toe (recently treated for infection) does not appear consistent with dactylitis. She reports mild psoriasis on her feet, not seen derm in several years.   - We discussed treatment options including continuing Stelara  vs switching to alternative, could consider IL23 such as Tremfya or Skyrizi.  - Continue Stelara  q8 weeks. Discussed OK to switch to Steqeyma  biosimilar reviewing that it is same medication and should work the same.   - Pt reports diclofenac  gel was helpful but insurance now not covering and expensive. Will try sending to [facility] pharmacy to see if able to get more cost effectively.      High risk medication monitoring:   - Patient is currently taking Stelara ; no regular monitoring needed  - Patient had a quant gold in 09/03/2016 that was neg.  she does not have any risk factors for TB exposure - no travel to TB-endemic area, does not volunteer, work, or live at [employer] or correctional facility, does not care for TB patients, no contact with individual with active pulmonary TB. Does not require repeat quant gold unless she has new risk factors for TB exposure. TB risk factors were reviewed: 10/22/2023       Immunization Counseling:  Prevnar PCV-20: 10/03/2020    We discussed the above including diagnosis and recommendations, agreed on the above plan, and all questions were answered.    Follow-up: Return for Follow-up PsA in 12 months.    Carol Forte, MD, MSCI  Assistant Professor of Medicine  Department of Medicine/Division of Rheumatology  [employer]  at [facility]  [phone] clinic phone  [phone] clinic secure fax     I personally spent 24 minutes face-to-face and non-face-to-face in the care of this patient, which includes all pre, intra, and post visit time on the date of service.  All documented time was specific to the E/M visit and does not include any procedures that may have been performed.           Diagnoses and all orders for this visit:    Psoriatic arthritis   [L40.50]    Tendinitis, de Quervain's  -     diclofenac  sodium (VOLTAREN ) 1 % gel; Apply 2 g topically four (4) times a day as needed for pain.

## 2023-10-22 ENCOUNTER — Ambulatory Visit: Admit: 2023-10-22 | Discharge: 2023-10-23 | Payer: Medicare (Managed Care)

## 2023-10-22 DIAGNOSIS — L4050 Arthropathic psoriasis, unspecified: Principal | ICD-10-CM

## 2023-10-22 DIAGNOSIS — L409 Psoriasis, unspecified: Principal | ICD-10-CM

## 2023-10-22 DIAGNOSIS — M654 Radial styloid tenosynovitis [de Quervain]: Principal | ICD-10-CM

## 2023-10-22 MED ORDER — DICLOFENAC 1 % TOPICAL GEL
Freq: Four times a day (QID) | TOPICAL | 11 refills | 57.00000 days | Status: CP | PRN
Start: 2023-10-22 — End: ?

## 2023-11-24 MED ORDER — EMPTY CONTAINER
2 refills | 0.00000 days
Start: 2023-11-24 — End: ?

## 2023-11-24 NOTE — Unmapped (Signed)
 [facility] Clinical Assessment & Refill Coordination Note    Carol Caldwell, DOB: 05/13/1955  Phone: [phone] (home)     All above HIPAA information was verified with patient.     Was a Nurse, learning disability used for this call? No    Specialty Medication(s):   Inflammatory Disorders: Steqeyma      Current Medications[1]     Changes to medications: Carol Caldwell reports no changes at this time.    Medication list has been reviewed and updated in Epic: Yes  Patient did not want to review all medications     Allergies[2]    Changes to allergies: No    Allergies have been reviewed and updated in Epic: Yes    SPECIALTY MEDICATION ADHERENCE          Specialty medication(s) dose(s) confirmed: Regimen is correct and unchanged.     Are there any concerns with adherence? No    Adherence counseling provided? Not needed    CLINICAL MANAGEMENT AND INTERVENTION      Clinical Benefit Assessment:    Do you feel the medicine is effective or helping your condition? Yes    Clinical Benefit counseling provided? Feels the same as on the Stelara  , See     Adverse Effects Assessment:    Are you experiencing any side effects? No    Are you experiencing difficulty administering your medicine? No    Quality of Life Assessment:    Quality of Life    Rheumatology  Oncology  Dermatology  Cystic Fibrosis          How many days over the past month did your PsA  keep you from your normal activities? For example, brushing your teeth or getting up in the morning. Patient declined to answer    Have you discussed this with your provider? Not needed    Acute Infection Status:    Acute infections noted within Epic:  No active infections    Patient reported infection: None    Therapy Appropriateness:    Is therapy appropriate based on current medication list, adverse reactions, adherence, clinical benefit and progress toward achieving therapeutic goals? Yes, therapy is appropriate and should be continued     Clinical Intervention:    Was an intervention completed as part of this clinical assessment? No    DISEASE/MEDICATION-SPECIFIC INFORMATION      For patients on injectable medications: Patient currently has 0 doses left.  Next injection is scheduled for 8/29.    Chronic Inflammatory Diseases: Have you experienced any flares in the last month? No    PATIENT SPECIFIC NEEDS     Does the patient have any physical, cognitive, or cultural barriers? No    Is the patient high risk? No    Does the patient require physician intervention or other additional services (i.e., nutrition, smoking cessation, social work)? No    Does the patient have an additional or emergency contact listed in their chart? Yes    SOCIAL DETERMINANTS OF HEALTH     At the [facility], we have learned that life circumstances - like trouble affording food, housing, utilities, or transportation can affect the health of many of our patients.   That is why we wanted to ask: are you currently experiencing any life circumstances that are negatively impacting your health and/or quality of life? Patient declined to answer    Social Drivers of Health     Food Insecurity: Not on file   Tobacco Use: Low  Risk  (10/22/2023)    Patient History     Smoking Tobacco Use: Never     Smokeless Tobacco Use: Never     Passive Exposure: Not on file   Transportation Needs: Not on file   Alcohol Use: Not on file   Housing: Not on file   Physical Activity: Not on file   Utilities: Not on file   Stress: Not on file   Interpersonal Safety: Not on file   Substance Use: Not on file (02/16/2023)   Intimate Partner Violence: Not on file   Social Connections: Not on file   Financial Resource Strain: Not on file   Health Literacy: Not on file   Internet Connectivity: Not on file       Would you be willing to receive help with any of the needs that you have identified today? Not applicable       SHIPPING     Specialty Medication(s) to be Shipped:   Inflammatory Disorders: Steqeyma     Other medication(s) to be shipped: No additional medications requested for fill at this time    Specialty Medications not needed at this time: N/A     Changes to insurance: No    Cost and Payment: Patient has a $0 copay, payment information is not required.    Delivery Scheduled: Yes, Expected medication delivery date: 8/20.     Medication will be delivered via Same Day Courier to the confirmed prescription address in [facility].    The patient will receive a drug information handout for each medication shipped and additional FDA Medication Guides as required.  Verified that patient has previously received a Conservation officer, historic buildings and a Surveyor, mining.    The patient or caregiver noted above participated in the development of this care plan and knows that they can request review of or adjustments to the care plan at any time.      All of the patient's questions and concerns have been addressed.    Carol Caldwell, PharmD   [facility] Specialty Pharmacist       [1]   Current Outpatient Medications   Medication Sig Dispense Refill    cholecalciferol , vitamin D3-10 mcg, 400 unit,, 10 mcg (400 unit) cap Take 1 capsule (10 mcg total) by mouth daily.      clindamycin  (CLEOCIN ) 300 MG capsule Take 1 capsule (300 mg total) by mouth Three (3) times a day. (Patient not taking: Reported on 11/24/2023) 30 capsule 0    diclofenac  sodium (VOLTAREN ) 1 % gel Apply 2 g topically four (4) times a day as needed for pain. (Patient not taking: Reported on 11/24/2023) 450 g 11    DULoxetine  (CYMBALTA ) 60 MG capsule Take 1 capsule (60 mg total) by mouth daily.      empagliflozin (JARDIANCE) 10 mg tablet Take 1 tablet (10 mg total) by mouth daily.      empty container Misc Use as directed 1 each 2    empty container Misc USE AS DIRECTED 1 each 2    enalapril  (VASOTEC ) 5 MG tablet Take 1 tablet (5 mg total) by mouth daily. 90 tablet 1    furosemide  (LASIX ) 40 MG tablet Take 1 tablet by mouth daily as needed for swelling. Weigh self daily, take 1 tablet if >3 lb weight gain in 24 hours or >5 lbs in 1 week      gabapentin  (NEURONTIN ) 300 MG capsule Take 2 capsules (600 mg total) by mouth Three (  3) times a day.      rosuvastatin  (CRESTOR ) 40 MG tablet Take 1 tablet (40 mg total) by mouth daily. 90 tablet 3    SITagliptin phosphate (JANUVIA) 50 MG tablet Take 1 tablet (50 mg total) by mouth daily.      spironolactone  (ALDACTONE ) 25 MG tablet Take 1 tablet (25 mg total) by mouth daily.      ustekinumab -stba (STEQEYMA ) 45 mg/0.5 mL Syrg Inject the contents of 1 syringe (45 mg) under the skin every 8 weeks. 1 mL 3    vit C,E-Zn-coppr-lutein-zeaxan (PRESERVISION AREDS-2) 250-90-40-1 mg cap Take by mouth daily.       No current facility-administered medications for this visit.   [2] No Known Allergies

## 2023-12-02 MED FILL — STEQEYMA 45 MG/0.5 ML SUBCUTANEOUS SYRINGE: SUBCUTANEOUS | 56 days supply | Qty: 0.5 | Fill #1

## 2023-12-15 NOTE — Unmapped (Signed)
 New Patient Clinic Note    Referring Physician:  Mabel Barrio Alexand*    History of Present Illness:    Carol Caldwell is a 68 y.o. female with history of T2DM, HTN, psoriatic arthritis, who presents for new patient evaluation of toe infection.    Location: right great toe wound, plantar aspect   Nature: throbbing  Duration estimate: 6 months  Pain level at it's highest: 10      Onset: wearing shoes and at night  Course: gradually worsening  Ancillary tests: X-ray  Treatment (previous): change shoes     ED note reviewed :The patient was seen in the ED on 09/23/23, at which time her XR was unremarkable. She was discharged on a course of clindamycin  and with topical Bactroban . She denies any fevers, chills, or other signs of infection presently.     She has a history of chronic leg swelling, ongoing for about 10 years. She denies tobacco use.     Last A1c was 6.5% about two months ago per patient.     Review of Systems:     Musculoskeletal:  Denies back pain or joint pain   Integument:  Denies rash   Neurologic:  Denies headache, focal weakness or sensory changes     BP 142/70  - Pulse 83  - Temp 36.8 ??C (98.3 ??F) (Temporal)       Allergies:  Allergies[1]    Medications:   Encounter Medications[2]    Medical History:  Past Medical History[3]    Surgical History:  Past Surgical History[4]    Social History:  Social History[5]    Family History:  Family History[6]      Physical Exam:   Consitutional: No acute distress, alert and oriented.    Vascular:   Palpable 2/4 pulses of the DP artery bilateral feet  PT pulse difficult to palpate bilateral feet, normal triphasic signal with handheld doppler   Temperature gradient within normal limits  Capillary refill brisk x 10  No varicosities noted bilaterally    Neurologic:    Right Left   10 gram filament [x]  Present  []  Not present  []  Intermittent []  Present  []  Not present  [x]  Intermittent   Positional sense great toe joint [x]  Present  []  Not present [x]  Present  []  Not present   128 cps vibratory sense [x]  Diminished (mild)  []  Intact [x]  Diminished (mild)  []  Intact   Deep tendon reflexes 2/5 [x]  2/5 normal  []  Abnormal [x]  2/5 normal  []  Abnormal   Babinski sign []  Present  [x]  Not present []  Present  [x]  Not present      Orthopedic:   No evidence of Charcot  Prominent plantar central aspect of the bilateral great toes, 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       Dermatologic:   Crescent-shaped wound to plantar right great toe, measuring 7 x 3 x 2 mm post-debridement    Wound 12/16/23 Toe (Comment which one) Right Great toe, plantar (Active)   Wound Image   12/16/23 1028   Wound Status Not Healed 12/16/23 1028   Pain 7 12/16/23 1028   Dressing Status      Removed;Other (Comment) 12/16/23 1028   Wound Length (cm) 0.1 cm 12/16/23 1028   Wound Width (cm) 0.1 cm 12/16/23 1028   Wound Depth (cm) 0.2 cm 12/16/23 1028   Wound Surface Area (cm^2) 0.01 cm^2 12/16/23 1028   Wound Volume (cm^3) 0.001 cm^3 12/16/23 1028   Peri-wound Assessment  Callus 12/16/23 1028       Test Results  Hemoglobin A1c ranging from 6.2% to 7.6% over the past two years. Last was 7.4% on 02/17/23 per chart review.     BMP trend reviewed. History of AKI in 09/2022, which has since resolved:    02/17/23 1013 01/29/23 0857 12/26/22 0820 10/10/22 0534 10/09/22 0546   10/09/22 0013 10/08/22 1217    Sodium 140 137 R 140 137 R 130 Low  R 128 Low  R 128 Low  CM    Potassium 3.4 Low  3.6 R 4.0 4.0 R 3.8 R 4.5 R 4.9    Chloride 102 107 R 104 104 R 98 R 97 Low  R 93 Low     CO2 26.8 27.0 R 25.5 25.6 R 21.8 R 23.0 R 20.4 Low     Anion Gap 11 3 Low  R 11 7 R 10 R 8 R 15    BUN 14 24 High  R 22 28 High  R 32 High  R 37 High  R 40 High     Creatinine 1.12 1.19 High  R 0.98 1.08 High  R 1.98 High  R 2.25 High  R 2.12 High     BUN/Creatinine Ratio 13 20 R 22 26 R 16 R 16 R 19    eGFR CKD-EPI (2021) Female 54 Low  50 Low  CM 63 CM 56 Low  CM 27 Low  CM 23 Low  CM 25 Low  CM   Comment: eGFR calculated with CKD-EPI 2021 equation in accordance with [employer] and [employer] of Nephrology Task Force recommendations.    Glucose 179 High  181 High  R 178 High  157 R 137 R 149 R 209 High     Calcium 8.8 8.9 R 8.8 9.2 R 8.7 R 9.3 R 9.2     Independent review of labs and interpretation done.  Repeat labs needed to evaluate kidney function for MRI.    Imaging:   XR right first toe 09/23/23 - No acute osseous abnormality. 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     Results have been independently reviewed and interpreted.      Assessment/Recommendations:      Plan:     Right great toe wound - Wound was debrided today. She has normal vitals in clinic, no fevers or chills. No clinical evidence of acute infection, but concern due to chronic edema and ulcer for OM subacute.  stat right foot MRI (with and without contrast), BMP, CRP, CBC, and ESR were ordered for infection rule out. Her June 2024 renal impairment episode was documented on the MRI order to ensure appropriate contrast dosing. Recommended xeroform and gauze dressings, and a prescription was provided for an offloading DH II shoe. Until she obtains the [facility] shoe, recommended Crocs. This wound places her at risk for amputation - discussed if wose come to ED    Chronic diabetic neuropathy - Patient was given instructions on diabetic foot care, including not going barefoot, checking water temperature with elbow, and frequent monitoring of feet for open sores or wounds. A prescription was provided today for custom diabetic shoes with offloading for the great toes.     Social determinants - access to healthcare - DH shoe not covered by insurance     I communicated with  our social worker with need and plan , Li, regarding financial assistance for the patient's health needs not covered by insurance.  RTC in 2 weeks. Return and ED precautions discussed.       PROCEDURE:   11042 - Surgical excisional debridement of plantar right great toe  Based on the necrotic tissue and biofilm present, the decision was made to debride at location above. All Benefits, risks, and alternatives were explained in detail to the patient. A time out was taken prior to the procedure. The area was prepped in standard clinic fashion. Sharp, surgical debridement was performed to fully debride and remove all central devitalized and necrotic tissue including subcutaneous tissue to bleeding granulation tissue with the use of a curette, forceps and scalpel. Blood loss was minimal.  Hemostasis was achieved with pressure and the post-debridement measurements are documented in the chart. Offloading was discussed. Local anesthesia achieved with lidocaine topical 4% for 15 minute prior to debridement.    Dressing: Xeroform    Offloading: DH II shoe      ______________________________________________________________________    Documentation assistance was provided by Philis Silvan, Scribe, on December 16, 2023 at 11:25 AM for [name], DPM.    ----------------------------------------------------------------------------------------------------------------------  December 16, 2023 11:55 AM. Documentation assistance provided by the Scribe. I was present during the time the encounter was recorded. The information recorded by the Scribe was done at my direction and has been reviewed and validated by me.  ----------------------------------------------------------------------------------------------------------------------         [1] No Known Allergies  [2]   Outpatient Encounter Medications as of 12/16/2023   Medication Sig Dispense Refill    cholecalciferol , vitamin D3-10 mcg, 400 unit,, 10 mcg (400 unit) cap Take 1 capsule (10 mcg total) by mouth daily.      clindamycin  (CLEOCIN ) 300 MG capsule Take 1 capsule (300 mg total) by mouth Three (3) times a day. (Patient not taking: Reported on 11/24/2023) 30 capsule 0    diclofenac  sodium (VOLTAREN ) 1 % gel Apply 2 g topically four (4) times a day as needed for pain. (Patient not taking: Reported on 11/24/2023) 450 g 11    DULoxetine  (CYMBALTA ) 60 MG capsule Take 1 capsule (60 mg total) by mouth daily.      empagliflozin (JARDIANCE) 10 mg tablet Take 1 tablet (10 mg total) by mouth daily.      empty container Misc Use as directed 1 each 2    empty container Misc USE AS DIRECTED 1 each 2    enalapril  (VASOTEC ) 5 MG tablet Take 1 tablet (5 mg total) by mouth daily. 90 tablet 1    furosemide  (LASIX ) 40 MG tablet Take 1 tablet by mouth daily as needed for swelling. Weigh self daily, take 1 tablet if >3 lb weight gain in 24 hours or >5 lbs in 1 week      gabapentin  (NEURONTIN ) 300 MG capsule Take 2 capsules (600 mg total) by mouth Three (3) times a day.      rosuvastatin  (CRESTOR ) 40 MG tablet Take 1 tablet (40 mg total) by mouth daily. 90 tablet 3    SITagliptin phosphate (JANUVIA) 50 MG tablet Take 1 tablet (50 mg total) by mouth daily.      spironolactone  (ALDACTONE ) 25 MG tablet Take 1 tablet (25 mg total) by mouth daily.      ustekinumab -stba (STEQEYMA ) 45 mg/0.5 mL Syrg Inject the contents of 1 syringe (45 mg) under the skin every 8 weeks. 1 mL 3    vit C,E-Zn-coppr-lutein-zeaxan (PRESERVISION AREDS-2) 250-90-40-1 mg cap Take by mouth daily.       No facility-administered encounter medications on file as  of 12/16/2023.   [3]   Past Medical History:  Diagnosis Date    Diabetes mellitus    (CMS-HCC)     Psoriasis     Psoriatic arthritis    (CMS-HCC)    [4]   Past Surgical History:  Procedure Laterality Date    BREAST BIOPSY Left     HYSTERECTOMY      NOne     [5]   Social History  Socioeconomic History    Marital status: Married     Spouse name: None    Number of children: None    Years of education: None    Highest education level: None   Tobacco Use    Smoking status: Never    Smokeless tobacco: Never   Vaping Use    Vaping status: Never Used   Substance and Sexual Activity    Alcohol use: Not Currently     Comment: socially    Drug use: No   Other Topics Concern    Do you use sunscreen? No    Tanning bed use? No    Are you easily burned? No    Excessive sun exposure? No    Blistering sunburns? No   [6]   Family History  Problem Relation Age of Onset    Diabetes Mother     Cancer Sister     Melanoma Neg Hx     Basal cell carcinoma Neg Hx     Squamous cell carcinoma Neg Hx

## 2023-12-16 ENCOUNTER — Ambulatory Visit: Admit: 2023-12-16 | Discharge: 2023-12-17 | Payer: Medicare (Managed Care) | Attending: Podiatrist | Primary: Podiatrist

## 2023-12-16 DIAGNOSIS — E11621 Type 2 diabetes mellitus with foot ulcer: Principal | ICD-10-CM

## 2023-12-16 DIAGNOSIS — L97512 Non-pressure chronic ulcer of other part of right foot with fat layer exposed: Principal | ICD-10-CM

## 2023-12-16 DIAGNOSIS — L97519 Non-pressure chronic ulcer of other part of right foot with unspecified severity: Principal | ICD-10-CM

## 2023-12-16 NOTE — Unmapped (Addendum)
 WOUND CARE INSTRUCTIONS:        Wash area with wound wash or antibacterial soap and water,  and moisturize the area without getting moisturizer on the wound bed.    Dressing: xeroform gauze, gauze and tape or band aid.  Change Daily.    You can get labs and/or X-rays at [facility]  [address]  [place], [place] [address]             .      If you have any questions or concerns regarding your wound or wound care, please contact us  at the [facility] Wound Healing and Podiatry clinic at [phone] or Vascular Surgery at [phone].   If your wound starts to develop the following , please call the [facility] for further advise:     Increased drainage   Redness around the wound   Strong odor from the wound when changing the bandages   Increased pain    Please do not hesitate to leave a voicemail on the nurse line. We make every effort to return your call the same day or the next day. Please leave a clear message with your name, date of birth,  and your medical record number. Leave a brief description of your problem. You may also send a picture through [facility] to help us  with medical decision making. [place] should not be used for urgent/emergent situations, but a picture of the wound and dressings removed are helpful.     If you are experiencing the following, please call us  for advise or consider going to the nearest local Emergency Department or call 911.     Fever of 100 F   Nausea or Vomitting   Pus draining from your wound   Redness of the whole foot or leg   Severe increase in pain above your baseline.    [facility]  [phone]  or  [facility] Vascular Surgery  [phone]     [name], DPM

## 2023-12-16 NOTE — Unmapped (Signed)
 [facility]  Outpatient Social Work       Social worker contacted the patient regarding financial assistance for health needs not covered by insurance.     Patient reports no transportation barriers and no food insecurities at this time.    Patient stated she plans to contact [facility] to schedule an appointment for diabetic footwear and offloading needs.  Social worker informed the patient to reach out if any barriers arise. A follow-up is scheduled in two weeks to reassess needs and ensure continuity of care.    The patient has been given the social worker's contact information and is encouraged to reach out with any questions or concerns. The social worker remains available to provide ongoing support, information, and resources as needed.    Alejandra Chad, LCSW   Ambulatory Social Worker II   [facility]  Office [phone])

## 2023-12-16 NOTE — Unmapped (Signed)
 Subjective:   Location: right great toe and left great toe  Nature:  throbbing  Duration estimate: 6 months  Pain level at it's highest: 10   Onset: wearing shoes and at night  Course: gradually worsening  Ancillary tests: X-ray  Treatment (previous): change shoes

## 2023-12-17 NOTE — Unmapped (Signed)
 [facility]  Outpatient Social Work       SW followed up with patient to confirm that she was able to schedule appointment with [facility]. Patient reports that she has an appointment scheduled on January 13, 2024.     The patient has been given the social worker's contact information and is encouraged to reach out with any questions or concerns. The social worker remains available to provide ongoing support, information, and resources as needed.    Alejandra Chad, LCSW   Ambulatory Social Worker II   [facility]  Office [phone])

## 2023-12-23 ENCOUNTER — Ambulatory Visit: Admit: 2023-12-23 | Discharge: 2023-12-24 | Payer: Medicare (Managed Care)

## 2023-12-23 LAB — BASIC METABOLIC PANEL
ANION GAP: 14 mmol/L (ref 5–14)
BLOOD UREA NITROGEN: 22 mg/dL (ref 9–23)
BUN / CREAT RATIO: 15
CALCIUM: 9.7 mg/dL (ref 8.7–10.4)
CHLORIDE: 90 mmol/L — ABNORMAL LOW (ref 98–107)
CO2: 29.6 mmol/L (ref 20.0–31.0)
CREATININE: 1.5 mg/dL — ABNORMAL HIGH (ref 0.55–1.02)
EGFR CKD-EPI (2021) FEMALE: 38 mL/min/1.73m2 — ABNORMAL LOW (ref >=60)
GLUCOSE RANDOM: 511 mg/dL (ref 70–179)
POTASSIUM: 4.5 mmol/L (ref 3.4–4.8)
SODIUM: 134 mmol/L — ABNORMAL LOW (ref 135–145)

## 2023-12-23 LAB — CBC W/ AUTO DIFF
BASOPHILS ABSOLUTE COUNT: 0 10*9/L (ref 0.0–0.1)
BASOPHILS RELATIVE PERCENT: 0.3 %
EOSINOPHILS ABSOLUTE COUNT: 0 10*9/L (ref 0.0–0.5)
EOSINOPHILS RELATIVE PERCENT: 0.4 %
HEMATOCRIT: 35.7 % (ref 34.0–44.0)
HEMOGLOBIN: 12.2 g/dL (ref 11.3–14.9)
LYMPHOCYTES ABSOLUTE COUNT: 2.3 10*9/L (ref 1.1–3.6)
LYMPHOCYTES RELATIVE PERCENT: 53.7 %
MEAN CORPUSCULAR HEMOGLOBIN CONC: 34.1 g/dL (ref 32.0–36.0)
MEAN CORPUSCULAR HEMOGLOBIN: 30.8 pg (ref 25.9–32.4)
MEAN CORPUSCULAR VOLUME: 90.3 fL (ref 77.6–95.7)
MEAN PLATELET VOLUME: 8.7 fL (ref 6.8–10.7)
MONOCYTES ABSOLUTE COUNT: 0.3 10*9/L (ref 0.3–0.8)
MONOCYTES RELATIVE PERCENT: 6 %
NEUTROPHILS ABSOLUTE COUNT: 1.7 10*9/L — ABNORMAL LOW (ref 1.8–7.8)
NEUTROPHILS RELATIVE PERCENT: 39.6 %
NUCLEATED RED BLOOD CELLS: 0 /100{WBCs} (ref <=4)
PLATELET COUNT: 188 10*9/L (ref 150–450)
RED BLOOD CELL COUNT: 3.96 10*12/L (ref 3.95–5.13)
RED CELL DISTRIBUTION WIDTH: 14.7 % (ref 12.2–15.2)
WBC ADJUSTED: 4.2 10*9/L (ref 3.6–11.2)

## 2023-12-23 LAB — SEDIMENTATION RATE: ERYTHROCYTE SEDIMENTATION RATE: 40 mm/h — ABNORMAL HIGH (ref 0–30)

## 2023-12-23 LAB — C-REACTIVE PROTEIN: C-REACTIVE PROTEIN: 16.4 mg/L — ABNORMAL HIGH (ref <=10.0)

## 2023-12-23 NOTE — Unmapped (Signed)
 Uncontrolled blood sugars and I was contacted through the [facility] system regarding critical lab value and contacted the patient via telephone who is at home resting comfortably.  She her blood glucose was over 500 and patient reports she had taken the test in a fasting state.  She was advised to go through the emergency room to have her blood sugar managed and agreed to go to [facility].  She reports no chills fever night sweats and in our conversation I also went over her other labs which showed elevated inflammatory markers and she is awaiting her MRI scheduling which I can see has been ordered but not completed.  I will be reaching out to our schedulers to work on this and I also confirmed her follow-up appointment on September 15 for wound ulcer checkup.    HK

## 2023-12-26 ENCOUNTER — Inpatient Hospital Stay: Admit: 2023-12-26 | Discharge: 2023-12-26 | Payer: Medicare (Managed Care)

## 2023-12-26 MED ADMIN — gadopiclenol (ELUCIREM,VUEWAY) injection 7.5 mL: 7.5 mL | INTRAVENOUS | @ 14:00:00 | Stop: 2023-12-26

## 2023-12-27 NOTE — Unmapped (Signed)
 HISTORY OF PRESENT ILLNESS: Carol Caldwell is a 68 y.o. female with history of T2DM, HTN, psoriatic arthritis, who returns to clinic for follow up visit. At last visit on 12/23/23, patient was advised to go to ED for uncontrolled blood glucose.    Today, patient reports she did not go to emergency room to check her elevated blood sugars. She reports her blood sugar was 333 this morning. She denies fever, chills, and night sweats. She did not have blood sugars checked following last visit. Patient has appointment with Orthotics on October 1st.     Elevated blood glucose obtained during today's visit was 305.     Last A1c was 6.5% about two months ago per patient.     ROS: See HPI, otherwise 10 systems reviewed is negative.    RX/ ALLERGIES/ MED HX/SURG HX/ SOC HX/FAM HX: Reviewed & updated in Epic    PHYSICAL EXAM:   Consitutional: No acute distress, alert and oriented.    Neurologic  Unchanged.    Vascular:   Unchanged.    Orthopedic:   Unchanged     Dermatologic:   Wound of right 1st toe post debridement measuring 3 mm x 2 mm x 1 mm    Wound 12/16/23 Toe (Comment which one) Right Great toe, plantar (Active)   Wound Image   12/28/23 0941   Wound Status Not Healed 12/28/23 0941   Pain 0 12/28/23 0941   Dressing Status      Removed 12/28/23 0941   Wound Length (cm) pre 0.2 cm 12/28/23 0941   Wound Width (cm) pre 0.1 cm 12/28/23 0941   Wound Depth (cm) pre 0.1 cm 12/28/23 0941   Wound Surface Area (cm^2) 0.02 cm^2 12/28/23 0941   Wound Volume (cm^3) 0.001 cm^3 12/28/23 0941   Wound Healing % 0 12/28/23 0941   Peri-wound Assessment      Callus 12/28/23 0941   Margins Undefined edges 12/28/23 0941   Odor None 12/28/23 0941   Site Assessment Sloughing;Granulation - early/partial 12/28/23 0941   Granulation % 26-50% 12/28/23 0941   Slough % 26-50% 12/28/23 0941   Encounter Subsequent 12/28/23 0941   Thickness Full Thickness 12/28/23 0941   Texture Callus 12/28/23 0941   Moisture Normal For Patient 12/28/23 0941 Temperature WNL 12/28/23 0941   Hypergranuation No 12/28/23 0941   Sinus Tract No 12/28/23 0941   Treatments Cleansed/Irrigation;Pharmaceutical agent 12/28/23 0941   Picture Taken Yes 12/28/23 0941     PROCEDURE:  97597 - Selective debridement of right 1st toe.  Based on the necrotic tissue and biofilm present, the decision was made to debride right 1st toe locations. All benefits, risks, and alternatives were explained in detail to the patient. A time out was taken prior to the procedure. The area was prepped in standard clinic fashion. Selective debridement was performed to fully debride all devitalized and necrotic skin and fibrous tissue down to bleeding granulation tissue with the use of a curette, forceps and scalpel. Hemostasis was achieved with pressure and the measurements were recorded. Offloading was discussed. Local anetsheia achieved with lidocaine topical 4% for 15 minute prior to debridement.    Dressing: Xeroform     Offloading: Crocs  Patient scheduled to obtain [facility] II shoe on 01/13/24     LABS/IMAGING:   XR right first toe 09/23/23 - No acute osseous abnormality. 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     Lower extremity MRI on 12/26/23 showing no acute osteomyelitis. Ulceration on the plantar aspect  of the great toe with surrounding localized soft tissue cellulitis.No abscess. Distal flexor and extensor tendons remain intact without tenosynovitis. Joint spaces appear preserved. No joint effusion or synovitis.      Results have been independently reviewed and interpreted.    Assessment and Plan:     Right great toe wound - Wound was debrided today. She has normal vitals in clinic, no fevers or chills. No clinical evidence of acute infection, but concern due to chronic edema and ulcer for OM subacute. Recommended xeroform and gauze dressings, and a prescription was provided for an offloading DH II shoe. Until she obtains the [facility] shoe, she will continue with Crocs. Patient is scheduled with Orthotics on 01/13/24. This wound places her at risk for amputation - ED precautions reviewed  Hallux limitus great toe right foot.  This is primarily functional.  Is contributing to jamming of the great toe on dorsiflexion and additional friction shearing and pressure that is leading to the breakdown of the skin and the ulceration.  Patient reports she does not want any surgical intervention for the problem.  In addition she has identifiable risk factors of uncontrolled diabetes.     Chronic diabetic neuropathy - Patient was given instructions on diabetic foot care, including not going barefoot, checking water temperature with elbow, and frequent monitoring of feet for open sores or wounds. Patient scheduled with Orthotics in 2 weeks to obtain custom diabetic shoes with offloading for the great toes.     Uncontrolled blood sugar levels- Patient reports blood glucose of 333 this morning. Rechecked in-clinic today and value was still over 300. Discussed importance of urgent follow up with PCP for this. Called Jasmine at patients PCP office and scheduled appointment for today at 3pm for further evaluation.    Social determinants - access to healthcare - DH shoe not covered by insurance. I previously communicated with our social worker with need and plan regarding financial assistance for the patient's health needs not covered by insurance.      RTC in 2 weeks. Return and ED precautions discussed.       ______________________________________________________________________    Documentation assistance was provided by Toy Register, Scribe, on December 28, 2023 at 10:10 AM for [name], DPM.    ----------------------------------------------------------------------------------------------------------------------  December 28, 2023 3:00 PM. Documentation assistance provided by the Scribe. I was present during the time the encounter was recorded. The information recorded by the Scribe was done at my direction and has been reviewed and validated by me.  ----------------------------------------------------------------------------------------------------------------------

## 2023-12-28 ENCOUNTER — Ambulatory Visit: Admit: 2023-12-28 | Discharge: 2023-12-29 | Payer: Medicare (Managed Care) | Attending: Podiatrist | Primary: Podiatrist

## 2023-12-28 DIAGNOSIS — E11621 Type 2 diabetes mellitus with foot ulcer: Principal | ICD-10-CM

## 2023-12-28 DIAGNOSIS — L97512 Non-pressure chronic ulcer of other part of right foot with fat layer exposed: Principal | ICD-10-CM

## 2023-12-28 DIAGNOSIS — M205X1 Other deformities of toe(s) (acquired), right foot: Principal | ICD-10-CM

## 2023-12-28 DIAGNOSIS — E1165 Type 2 diabetes mellitus with hyperglycemia: Principal | ICD-10-CM

## 2023-12-28 NOTE — Unmapped (Addendum)
 Dr. Georgiann has scheduled you to see a doctor in [place] this afternoon at 3:00 to discuss your high blood sugar levels.     WOUND CARE INSTRUCTIONS:        Wash area with wound wash or antibacterial soap and water,  and moisturize the area without getting moisturizer on the wound bed.    Dressing: Apply a single layer of xeroform gauze to the wound and cover with gauze and tape or band aid.  Change Daily.      If you have any questions or concerns regarding your wound or wound care, please contact us  at the [facility] Wound Healing and Podiatry clinic at [phone] or Vascular Surgery at [phone].   If your wound starts to develop the following , please call the [facility]ound [facility] for further advise:     Increased drainage   Redness around the wound   Strong odor from the wound when changing the bandages   Increased pain    Please do not hesitate to leave a voicemail on the nurse line. We make every effort to return your call the same day or the next day. Please leave a clear message with your name, date of birth,  and your medical record number. Leave a brief description of your problem. You may also send a picture through [facility] to help us  with medical decision making. [place] should not be used for urgent/emergent situations, but a picture of the wound and dressings removed are helpful.     If you are experiencing the following, please call us  for advise or consider going to the nearest local Emergency Department or call 911.     Fever of 100 F   Nausea or Vomitting   Pus draining from your wound   Redness of the whole foot or leg   Severe increase in pain above your baseline.    [facility] Wound Healing and Podiatry Center  [phone]  or  [facility] Vascular Surgery  [phone]     Kayla Georgiann, DPM

## 2024-01-04 ENCOUNTER — Other Ambulatory Visit (INDEPENDENT_AMBULATORY_CARE_PROVIDER_SITE_OTHER): Payer: Self-pay | Admitting: Nurse Practitioner

## 2024-01-04 DIAGNOSIS — R0989 Other specified symptoms and signs involving the circulatory and respiratory systems: Secondary | ICD-10-CM

## 2024-01-06 ENCOUNTER — Encounter (INDEPENDENT_AMBULATORY_CARE_PROVIDER_SITE_OTHER): Payer: Self-pay | Admitting: Nurse Practitioner

## 2024-01-06 ENCOUNTER — Ambulatory Visit (INDEPENDENT_AMBULATORY_CARE_PROVIDER_SITE_OTHER)

## 2024-01-06 ENCOUNTER — Ambulatory Visit (INDEPENDENT_AMBULATORY_CARE_PROVIDER_SITE_OTHER): Admitting: Nurse Practitioner

## 2024-01-06 VITALS — BP 130/82 | HR 79 | Ht 61.0 in | Wt 164.2 lb

## 2024-01-06 DIAGNOSIS — I10 Essential (primary) hypertension: Secondary | ICD-10-CM

## 2024-01-06 DIAGNOSIS — R0989 Other specified symptoms and signs involving the circulatory and respiratory systems: Secondary | ICD-10-CM

## 2024-01-06 DIAGNOSIS — R202 Paresthesia of skin: Secondary | ICD-10-CM

## 2024-01-06 DIAGNOSIS — R200 Anesthesia of skin: Secondary | ICD-10-CM

## 2024-01-06 DIAGNOSIS — E119 Type 2 diabetes mellitus without complications: Secondary | ICD-10-CM

## 2024-01-08 LAB — VAS US ABI WITH/WO TBI
Left ABI: 1.09
Right ABI: 1.27

## 2024-01-10 NOTE — Unmapped (Signed)
 HISTORY OF PRESENT ILLNESS: Carol Caldwell is a 68 y.o. female with history of T2DM, HTN, psoriatic arthritis, who returns to clinic for follow up visit. Patient was last seen on 12/28/23, at which time right great toe wound was debrided and Xeroform with gauze dressing and DH II offloading shoes were recommended and prescribed. Patient deferred surgery for right foot great toe hallux limitus. PCP office was contacted regarding uncontrolled blood sugar levels.     Today, patient reports to having no pain on the right great toe wound and denies any oozing. Patient also denies chills, fevers, or night sweats. Patient drove herself to the clinic. She also takes care of her son at home.      Last A1c was 6.5% about two months ago per patient. Last A1c on record is 7.4% 10 months ago.     ROS: See HPI, otherwise 10 systems reviewed is negative.    RX/ ALLERGIES/ MED HX/SURG HX/ SOC HX/FAM HX: Reviewed & updated in Epic    PHYSICAL EXAM:   Consitutional: No acute distress, alert and oriented.    Neurologic:  Unchanged.    Vascular:   Unchanged.    Orthopedic:   Unchanged.     Dermatologic:   Post debridement measurement 1 cm x 1.5 cm x 0.1 cm   Wound 12/16/23 Toe (Comment which one) Right Great toe, plantar (Active)   Wound Image   01/12/24 1345   Wound Status Not Healed 01/12/24 1345   Pain 0 01/12/24 1345   Dressing Status      Removed 01/12/24 1345   Wound Length (cm) 0.1 cm 01/12/24 1345   Wound Width (cm) 0.1 cm 01/12/24 1345   Wound Depth (cm) 0.1 cm 01/12/24 1345   Wound Surface Area (cm^2) 0.01 cm^2 01/12/24 1345   Wound Volume (cm^3) 0.001 cm^3 01/12/24 1345   Wound Healing % 0 01/12/24 1345   Peri-wound Assessment      Callus 01/12/24 1345   Margins Undefined edges 01/12/24 1345   Odor None 01/12/24 1345   Site Assessment Dry 01/12/24 1345   Granulation % 26-50% 01/12/24 0941   Slough % 26-50% 01/12/24 0941   Encounter Subsequent 01/12/24 0941   Thickness Full Thickness 12/3023 0941   Texture Callus 01/12/24 1345   Moisture Normal For Patient 01/12/24 1345   Temperature WNL 01/12/24 1345   Hypergranuation No 01/12/24 1345   Sinus Tract No 01/12/24 1345   Treatments Cleansed/Irrigation;Pharmaceutical agent 01/12/24 1345   Picture Taken Yes 01/12/24 1345   Dressing Other (Comment) 01/12/24 1403         PROCEDURE:  11042 - Surgical excisional  debridement of right great toe wound.  Based on the necrotic tissue and biofilm present, the decision was made to debride at locations above. All Benefits, risks, and alternatives were explained in detail to the patient. A time out was taken prior to the procedure. The area was prepped in standard clinic fashion. Sharp, surgical debridement was performed to fully debride and remove all central devitalized and necrotic tissue including subcutaneous tissue to bleeding granulation tissue with the use of a curette, forceps and scalpel. Blood loss was minimal.  Hemostasis was achieved with pressure and the post-debridement measurements are documented in the chart. Offloading was discussed. Local anesthesia achieved with lidocaine topical 4% for 15 minute prior to debridement.    Dressing: Xeroform and gauze    Offloading: DH Shoe       LABS/IMAGING:   PVL ABI 01/06/2024 was reviewed:  Right: Resting right ankle-brachial index is within normal range. The right toe-brachial index is normal.   Left: Resting left ankle-brachial index is within normal range. The left toe-brachial index is normal.     Right LE MRI 12/26/2023: No acute osteomyelitis was noted. Other findings:  Ulceration on the plantar aspect of the great toe with surrounding localized soft tissue cellulitis. No abscess. Distal flexor and extensor tendons remain intact without tenosynovitis. Joint spaces appear preserved. No joint effusion or synovitis.      Imaging was independently reviewed and interpreted.     Assessment and Plan:     Right great toe wound - Wound was debrided today. No clinical evidence of acute infection. Recommended xeroform and gauze dressings, and a prescription was provided for an offloading DH II shoe. Patient is scheduled with Orthotics on 01/13/24. Surgical solutions to change the pressure point were discussed but I counseled her to trial the new shoes. She is to protect the wound until she receives the shoe. Afterward, she is to break them in slowly. If the wound is healing well with the new shoes, patient can ambulate more. If there are any alarming or serious symptoms like purulence, patient is to visit the emergency room.    Hallux limitus great toe right foot.  This is primarily functional.  Stable at today's visit.  She wishes to continue to avoid surgery at wishes to work with the orthopedic shoes.  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    Chronic diabetic neuropathy - Patient was given instructions on diabetic foot care, including not going barefoot, checking water temperature with elbow, and frequent monitoring of feet for open sores or wounds. Patient scheduled with Orthotics tomorrow to obtain custom diabetic shoes with offloading for the great toes.     Uncontrolled blood sugar levels-reports she is monitoring blood glucose at home    Social determinants - access to healthcare - DH shoe not covered by insurance. I previously communicated with our social worker with need and plan regarding financial assistance for the patient's health needs not covered by insurance.  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    RTC in 2-3 weeks   Extra time was spent with this patient managing her pressure-relief and her other comorbidities associated with her wound and this was in excess of the evaluation management of the wound itself.  This includes the weightbearing management, reviewing her labs including her vascular studies that were done at an outside center at [facility] health and interpreting them.  This includes reviewing her MRI results as well as developing offloading plan of care that we have gone over with her and long-term planning.  Surgical and nonsurgical options discussed regarding the limited range of motion of the great toe causing the ulcer itself.  ______________________________________________________________________    Documentation assistance was provided by Rathisree Seenivasan, Scribe, on January 12, 2024 at 3:02 PM for [name], DPM.    ----------------------------------------------------------------------------------------------------------------------  January 12, 2024 3:54 PM. Documentation assistance provided by the Scribe. I was present during the time the encounter was recorded. The information recorded by the Scribe was done at my direction and has been reviewed and validated by me.  ----------------------------------------------------------------------------------------------------------------------

## 2024-01-12 ENCOUNTER — Ambulatory Visit: Admit: 2024-01-12 | Discharge: 2024-01-12 | Payer: Medicare (Managed Care) | Attending: Podiatrist | Primary: Podiatrist

## 2024-01-12 DIAGNOSIS — E1142 Type 2 diabetes mellitus with diabetic polyneuropathy: Principal | ICD-10-CM

## 2024-01-12 DIAGNOSIS — L97511 Non-pressure chronic ulcer of other part of right foot limited to breakdown of skin: Principal | ICD-10-CM

## 2024-01-12 DIAGNOSIS — E1165 Type 2 diabetes mellitus with hyperglycemia: Principal | ICD-10-CM

## 2024-01-12 DIAGNOSIS — E11621 Type 2 diabetes mellitus with foot ulcer: Principal | ICD-10-CM

## 2024-01-12 DIAGNOSIS — M205X9 Other deformities of toe(s) (acquired), unspecified foot: Principal | ICD-10-CM

## 2024-01-12 NOTE — Unmapped (Signed)
 WOUND CARE INSTRUCTIONS:        Wash area with wound cleanser ,  and moisturize the area without getting moisturizer on the wound bed.    Dressing: xeroform , guaze and tape      If you have any questions or concerns regarding your wound or wound care, please contact us  at the [facility] Wound Healing and Podiatry clinic at [phone] or Vascular Surgery at [phone].   If your wound starts to develop the following , please call the [facility] Wound Clinic for further advise:     Increased drainage   Redness around the wound   Strong odor from the wound when changing the bandages   Increased pain    Please do not hesitate to leave a voicemail on the nurse line. We make every effort to return your call the same day or the next day. Please leave a clear message with your name, date of birth,  and your medical record number. Leave a brief description of your problem. You may also send a picture through [facility] to help us  with medical decision making. [place] should not be used for urgent/emergent situations, but a picture of the wound and dressings removed are helpful.     If you are experiencing the following, please call us  for advise or consider going to the nearest local Emergency Department or call 911.     Fever of 100 F   Nausea or Vomitting   Pus draining from your wound   Redness of the whole foot or leg   Severe increase in pain above your baseline.    [facility] Wound Healing and Podiatry Center  [phone]  or  [facility] Vascular Surgery  [phone]

## 2024-01-13 ENCOUNTER — Ambulatory Visit: Admit: 2024-01-13 | Discharge: 2024-01-14 | Payer: Medicare (Managed Care)

## 2024-01-13 NOTE — Unmapped (Cosign Needed)
 Statement of Certifying Physician for Therapeutic Shoes     Patient Name: Carol Caldwell    MBI: [id number];    DOB:  Mar 06, 1956      I certify that all of the following statements are true:     1. This patient has diabetes mellitus.     2. This patient has one or more of the following conditions. (Check all that apply):   []  History of partial or complete amputation of the foot   [x]  History of previous foot ulceration   []  History of pre-ulcerative callus   [x]  Peripheral neuropathy with evidence of callus formation   []  Foot deformity   []  Poor circulation     3. I am treating this patient under a comprehensive plan of care for his/her diabetes.     4. This patient needs special shoes (depth or custom-molded shoes) because of his/her diabetes.       Physician signature:  _________________________________________      Date Signed: _______________________________________________      Physician name (printed - MUST BE AN M.D. OR D.O.): Lang Darner, MD    Physician [address], [place], [place] [address]         Physician NPI:  [id number]

## 2024-01-13 NOTE — Unmapped (Signed)
 Offloading Shoe    Purpose/Function  An offloading shoe is meant to reduce pressure on part of your foot to prevent injury or improve healing.  There are many similar devices which serves this function.  The image to the right is an example.      Putting device on / taking it off  The offloading shoe can be worn over a sock for comfort and over dressings or bandages.      To don the brace (put it on)  Loosen the Velcro closures on the shoe  Slide foot into shoe and place heel all the way down and to the back  Wrap the Velcro strap across your forefoot  Feed the ankle strap through the buckle on the opposite side and pull snugly.        To remove brace:  Remove Velcro fasteners  Pull foot out of shoe gently.   Store shoe in clean, dry location with the Velcro fastened to avoid Velcro getting caught on other clothes or accumulating debris.  Offloading shoes tend to be clunky; it should always be used with well-fitted sneakers or other footwear on the opposite side.      Wearing your device     The offloading shoe is an active brace - meaning that it is meant to be worn during physical activity.  Your physician may have specific instructions for you.  In general, it is meant to be worn at all times immediately after being prescribed.  It may be removed to rest / sleep.      Cleaning / Care / Infection Control  The shoe can be washed with soapy water.  Use a mild cleaning detergent.  Do not use bleach.  Do not place in dryer - set out to air dry.    The brace is meant to be used on the person for whom it was prescribed.  It should not be shared with other people or on other body parts.      Risks  The most notable risk to you of wearing this brace is skin irritation.  There is a risk of over-use causing skin irritation or blistering.  Be sure to wear a comfortable sock.  Check your skin frequently for signs of irritation.    While some redness is expected when wearing a brace tightly, it should go away within 20-30 minutes.  If you experience any blisters or signs of rash discontinue wearing and contact your provider.    Does not contain latex    How to contact  If you experience any discomfort or have questions about the use of this orthosis you may contact your care team by calling [phone] or via mychart.

## 2024-01-13 NOTE — Unmapped (Signed)
 [facility] [place]  Prosthetics & Orthotics  [phone]     Diabetic Footwear Initial Evaluation      Subjective     Carol Caldwell is a 68 y.o. female who presents for evaluation of Diabetic inserts and Shoes, and an off-loading shoe.    Referring provider: Dr, Georgiann    Provider managing Diabetes: Dr.Tobin    History     Medications Ordered Prior to Encounter[1]    Past Medical History[2]    Past Surgical History[3]    Family History[4]    Short Social History[5]    History of O&P care:  - None stated     Physical Exam     General appearance:  - Ulcer on plantar aspect of great toe     Pain:  - Pain on plantar aspect of first metarsal head when ranged into extension  - No current pain due to patient have taken pain medication before appointment     Foot and ankle posture:  - Unremarkable     Range of motion:  - Limited motion at Hallux into MTP extension    Gait:  - Unremarkable      (Check all that apply):   []  History of partial or complete amputation of the foot   [x]  History of previous foot ulceration   []  History of pre-ulcerative callus   [x]  Peripheral neuropathy with evidence of callus formation   []  Foot deformity (Specify)        -   []  Poor circulation   []  Other  [x]  This patient needs special shoes (depth or custom-molded shoes) because of his/her diabetes.     Assessment   Patient has an ulcer on her great toe. It is currently wrapped so it was not able to looked at as patient did not have necessary dressings to cover wound. Patient stated that she does not wear shoes while she is at home. She reported no falls in the last 6 months.   Off loader shoe was delivered to patient in order to help relief pressure off her great toe to encourage healing of the current ulcer. Area of ulceration was offloaded in sole of shoe. Patient understood she should be wearing this at home as well.       Considering today's findings, a pair of diabetic shoes and three pairs of diabetic custom molded foot orthoses are medical necessities to provide adequate support and protection to the patient's feet.    Device Data    Device Data    Base Device  Quantity: 1  Side: Right  Size: Medium  Description: Shoe Offload  Part Number: [id number]  Serial Number: lot # [id number]  Supplier: [employer]  Warranty: 90 Days  Action: Delivered      Actions taken today     Shape Capture:   - Measurement and foam box impressions taken today    Orthotic Design     [x]  Custom diabetic insoles   [x]  Diabetic footwear   []  Toe filler prosthesis   []  Carbon fiber footplate  []  Heat-molded Diabetic Insoles    Foot orthosis design: Standard Tri-Lam 3/16    Shoe choice: Anodyne #51     Shoe color: Black     Shoe size: 9 Wide     Need for Custom: [x]  Yes []  No   Mark all that apply     Anatomy could not reasonably be fit with pre-fabricated orthotics [x]  Yes []  No   Has used prefabricated  orthotics unsuccessfully in the past []  Yes [x]  No   Orthotics are designed to re-align foot biomechanics [x]  Yes []  No   History of Ulceration [x]  Yes []  No   Custom design is needed to prevent tissue injury [x]  Yes []  No   Patient has a healing fracture []  Yes [x]  No     Modifications expected to custom or prefabricated device   Size  [x]  Routine trimming anticipated   Alignment []     Other [x]  Off Load plantar aspect of great toe      Goals     Short Term:  Redistribute pressure evenly on feet  Relieve callus or ulcer areas  Improve alignment of the hind foot and forefoot  Accommodate unique alignment of the foot/ankle  Long Term:  Heal ulcer sites  Prevent future skin breakdown  Wear device full time while walking  Prevent progression of deformities  Patient Goals:  Prevent skin breakdown or irritation  Improve foot comfort with appropriate fitting devices  Increase activity level    Conclusion     Counseled patient about brace recommendations, timelines, prescription, authorization process and follow up.  Patient expressed understanding    [x]  New Patient Info provided   [x]  Medicare supplier standards provided    Plan    Return Visit:  fitting/delivery     Authorization expectations:      will request authorization     Ordering:    All items have been ordered in preparation for next visit         [1]   Current Outpatient Medications on File Prior to Visit   Medication Sig Dispense Refill    cholecalciferol , vitamin D3-10 mcg, 400 unit,, 10 mcg (400 unit) cap Take 1 capsule (10 mcg total) by mouth daily.      DULoxetine  (CYMBALTA ) 60 MG capsule Take 1 capsule (60 mg total) by mouth daily.      empagliflozin (JARDIANCE) 10 mg tablet Take 1 tablet (10 mg total) by mouth daily.      empty container Misc Use as directed 1 each 2    empty container Misc USE AS DIRECTED 1 each 2    enalapril  (VASOTEC ) 5 MG tablet Take 1 tablet (5 mg total) by mouth daily. 90 tablet 1    furosemide  (LASIX ) 40 MG tablet Take 1 tablet by mouth daily as needed for swelling. Weigh self daily, take 1 tablet if >3 lb weight gain in 24 hours or >5 lbs in 1 week      gabapentin  (NEURONTIN ) 300 MG capsule Take 2 capsules (600 mg total) by mouth Three (3) times a day.      rosuvastatin  (CRESTOR ) 40 MG tablet Take 1 tablet (40 mg total) by mouth daily. 90 tablet 3    SITagliptin phosphate (JANUVIA) 50 MG tablet Take 1 tablet (50 mg total) by mouth daily.      spironolactone  (ALDACTONE ) 25 MG tablet Take 1 tablet (25 mg total) by mouth daily.      ustekinumab -stba (STEQEYMA ) 45 mg/0.5 mL Syrg Inject the contents of 1 syringe (45 mg) under the skin every 8 weeks. 1 mL 3    vit C,E-Zn-coppr-lutein-zeaxan (PRESERVISION AREDS-2) 250-90-40-1 mg cap Take by mouth daily.       No current facility-administered medications on file prior to visit.   [2]   Past Medical History:  Diagnosis Date    Diabetes mellitus    (CMS-HCC)     Psoriasis     Psoriatic arthritis    (  CMS-HCC)    [3]   Past Surgical History:  Procedure Laterality Date    BREAST BIOPSY Left     HYSTERECTOMY      NOne     [4]   Family History  Problem Relation Age of Onset    Diabetes Mother     Cancer Sister     Melanoma Neg Hx     Basal cell carcinoma Neg Hx     Squamous cell carcinoma Neg Hx    [5]   Social History  Tobacco Use    Smoking status: Never    Smokeless tobacco: Never   Vaping Use    Vaping status: Never Used   Substance Use Topics    Alcohol use: Not Currently     Comment: socially    Drug use: No

## 2024-01-14 NOTE — Unmapped (Signed)
 Pt not due until 10.24.25. will schedule RC

## 2024-01-24 NOTE — Unmapped (Unsigned)
 HISTORY OF PRESENT ILLNESS: Carol Caldwell is a 68 y.o. female with history of T2DM, HTN, psoriatic arthritis, who returns to clinic for follow up visit. Patient was last seen on 01/12/2024, at which time right great toe wound was debrided and a prescription was provided for [facility] II shoe. Surgery was deferred for hallux limitus of right great toe. Chronic diabatic neuropathy was to be managed with custom diabatic shoes.     Today, patient reports ***.      Last A1c was 6.5% about three months ago per patient. Last A1c on record is 7.4% 11 months ago.     ROS: See HPI, otherwise 10 systems reviewed is negative.    RX/ ALLERGIES/ MED HX/SURG HX/ SOC HX/FAM HX: Reviewed & updated in Epic    PHYSICAL EXAM:   Consitutional: No acute distress, alert and oriented.    Neurologic:  Unchanged.    Vascular:   Unchanged.    Orthopedic:   Unchanged.     Dermatologic:   Post debridement measurement ***    *** wound chart      PROCEDURE:  None.     Last visit ***  [phone] - Surgical excisional  debridement of right great toe wound.  Based on the necrotic tissue and biofilm present, the decision was made to debride at locations above. All Benefits, risks, and alternatives were explained in detail to the patient. A time out was taken prior to the procedure. The area was prepped in standard clinic fashion. Sharp, surgical debridement was performed to fully debride and remove all central devitalized and necrotic tissue including subcutaneous tissue to bleeding granulation tissue with the use of a curette, forceps and scalpel. Blood loss was minimal.  Hemostasis was achieved with pressure and the post-debridement measurements are documented in the chart. Offloading was discussed. Local anesthesia achieved with lidocaine topical 4% for 15 minute prior to debridement.    Dressing: Xeroform and gauze    Offloading: DH Shoe       LABS/IMAGING:   None.     Assessment and Plan:     Right great toe wound - Wound was debrided today. No clinical evidence of acute infection. Recommended xeroform and gauze dressings, and a prescription was provided for an offloading DH II shoe. Patient is scheduled with Orthotics on 01/13/24. Surgical solutions to change the pressure point were discussed but I counseled her to trial the new shoes. She is to protect the wound until she receives the shoe. Afterward, she is to break them in slowly. If the wound is healing well with the new shoes, patient can ambulate more. If there are any alarming or serious symptoms like purulence, patient is to visit the emergency room.    Hallux limitus great toe right foot.  This is primarily functional.  Stable at today's visit.  She wishes to continue to avoid surgery at wishes to work with the orthopedic shoes.  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    Chronic diabetic neuropathy - Patient was given instructions on diabetic foot care, including not going barefoot, checking water temperature with elbow, and frequent monitoring of feet for open sores or wounds. Patient scheduled with Orthotics tomorrow to obtain custom diabetic shoes with offloading for the great toes.     Uncontrolled blood sugar levels-reports she is monitoring blood glucose at home    Social determinants - access to healthcare - DH shoe not covered by insurance. I previously communicated with our social worker with need  and plan regarding financial assistance for the patient's health needs not covered by insurance.  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    RTC ***    ***

## 2024-01-26 ENCOUNTER — Encounter (INDEPENDENT_AMBULATORY_CARE_PROVIDER_SITE_OTHER): Payer: Self-pay | Admitting: Nurse Practitioner

## 2024-01-26 NOTE — Progress Notes (Signed)
 Subjective:    Patient ID: Natalie Shepherd, female    DOB: [birth year], 68 y.o.   MRN: [id number] Chief Complaint  Patient presents with   New Patient (Initial Visit)    The patient presents today as a referral from her primary care physician in regards to concern for numbness and tingling as well as some decreased pedal pulses.  She notes that she has pain at night.  It tends to get her up and sometimes she needs pain pills or sometimes it is relieved by walking.  This has been ongoing for years.  She does not have any classic claudication-like symptoms.  There is currently no open wounds or ulcerations.  Today she has a right ABI 1.27 and a left 1.09.  She has a TBI 0.84 on the right and 0.75 on the left.  These would be considered normal.  She has strong triphasic waveforms on the right with multiphasic on the left.  There are also good toe waveforms.    Review of Systems  All other systems reviewed and are negative.      Objective:   Physical Exam Vitals reviewed.  HENT:     Head: Normocephalic.  Cardiovascular:     Rate and Rhythm: Normal rate.     Pulses:          Dorsalis pedis pulses are detected w/ Doppler on the right side and detected w/ Doppler on the left side.       Posterior tibial pulses are detected w/ Doppler on the right side and detected w/ Doppler on the left side.  Pulmonary:     Effort: Pulmonary effort is normal.  Skin:    General: Skin is warm and dry.  Neurological:     Mental Status: She is alert and oriented to person, place, and time.  Psychiatric:        Mood and Affect: Mood normal.        Behavior: Behavior normal.        Thought Content: Thought content normal.        Judgment: Judgment normal.     BP 130/82   Pulse 79   Ht 5' 1 (1.549 m)   Wt 164 lb 3.2 oz (74.5 kg)   BMI 31.03 kg/m   Past Medical History:  Diagnosis Date   Diabetes mellitus without complication (HCC)     Social History   Socioeconomic History   Marital  status: Single    Spouse name: Not on file   Number of children: Not on file   Years of education: Not on file   Highest education level: Not on file  Occupational History   Not on file  Tobacco Use   Smoking status: Never   Smokeless tobacco: Never  Substance and Sexual Activity   Alcohol use: Yes    Alcohol/week: 4.0 standard drinks of alcohol    Types: 4 Cans of beer per week   Drug use: No   Sexual activity: Not on file  Other Topics Concern   Not on file  Social History Narrative   Not on file   Social Drivers of Health   Financial Resource Strain: Not on file  Food Insecurity: Not on file  Transportation Needs: Not on file  Physical Activity: Not on file  Stress: Not on file  Social Connections: Not on file  Intimate Partner Violence: Not on file    Past Surgical History:  Procedure Laterality Date   BREAST BIOPSY Left 09/29/2016  us  bx/clip- neg    Family History  Problem Relation Age of Onset   Breast cancer Neg Hx     No Known Allergies     Latest Ref Rng & Units 09/20/2019    9:17 PM 07/26/2016    3:39 AM  CBC  WBC 4.0 - 10.5 K/uL 7.9  5.0   Hemoglobin 12.0 - 15.0 g/dL 88.8  88.9   Hematocrit 36.0 - 46.0 % 34.7  31.5   Platelets 150 - 400 K/uL 437  259       CMP     Component Value Date/Time   NA 135 09/20/2019 2117   K 4.7 09/20/2019 2117   CL 96 (L) 09/20/2019 2117   CO2 29 09/20/2019 2117   GLUCOSE 194 (H) 09/20/2019 2117   BUN 17 09/20/2019 2117   CREATININE 0.97 09/20/2019 2117   CALCIUM 9.1 09/20/2019 2117   GFRNONAA >60 09/20/2019 2117     VAS US  ABI WITH/WO TBI Result Date: 01/08/2024  LOWER EXTREMITY DOPPLER STUDY Patient Name:  Natalie Shepherd  Date of Exam:   01/06/2024 Medical Rec #: [id number]         Accession #:    [id number] Date of Birth: [birth year]         Patient Gender: F Patient Age:   68 years Exam Location:  [facility] Procedure:      VAS US  ABI WITH/WO TBI Referring Phys:  --------------------------------------------------------------------------------  Indications: Ulceration. High Risk Factors: Diabetes, no history of smoking. Other Factors: Decreased pulses.  Performing Technologist: Donnice Charnley RVT  Examination Guidelines: A complete evaluation includes at minimum, Doppler waveform signals and systolic blood pressure reading at the level of bilateral brachial, anterior tibial, and posterior tibial arteries, when vessel segments are accessible. Bilateral testing is considered an integral part of a complete examination. Photoelectric Plethysmograph (PPG) waveforms and toe systolic pressure readings are included as required and additional duplex testing as needed. Limited examinations for reoccurring indications may be performed as noted.  ABI Findings: +---------+------------------+-----+---------+--------+ Right    Rt Pressure (mmHg)IndexWaveform Comment  +---------+------------------+-----+---------+--------+ Brachial 137                                      +---------+------------------+-----+---------+--------+ PTA      174               1.27 triphasic         +---------+------------------+-----+---------+--------+ DP       172               1.26 triphasic         +---------+------------------+-----+---------+--------+ Great Toe115               0.84                   +---------+------------------+-----+---------+--------+ +---------+------------------+-----+---------+-------+ Left     Lt Pressure (mmHg)IndexWaveform Comment +---------+------------------+-----+---------+-------+ Brachial 133                                     +---------+------------------+-----+---------+-------+ PTA      125               0.91 biphasic         +---------+------------------+-----+---------+-------+ DP       150  1.09 triphasic        +---------+------------------+-----+---------+-------+ Great Toe103               0.75                   +---------+------------------+-----+---------+-------+ +-------+-----------+-----------+------------+------------+ ABI/TBIToday's ABIToday's TBIPrevious ABIPrevious TBI +-------+-----------+-----------+------------+------------+ Right  1.27       0.84                                +-------+-----------+-----------+------------+------------+ Left   1.09       0.75                                +-------+-----------+-----------+------------+------------+   Summary: Right: Resting right ankle-brachial index is within normal range. The right toe-brachial index is normal.  Left: Resting left ankle-brachial index is within normal range. The left toe-brachial index is normal.  *See table(s) above for measurements and observations.  Electronically signed by Cordella Shawl MD on 01/08/2024 at 7:07:01 AM.    Final        Assessment & Plan:   1. Numbness and tingling (Primary) Recommend:  The patient has atypical pain symptoms for vascular disease and on exam I do not find evidence of vascular pathology that would explain the patient's symptoms.  Noninvasive studies do not identify significant vascular problems  I suspect the symptoms are more neuropathic in nature.  Patient will follow-up with PCP for further treatment. The patient should continue walking and begin a more formal exercise program. The patient should continue his antiplatelet therapy and aggressive treatment of the lipid abnormalities.  Patient will follow-up with me on a PRN basis.  2. Essential hypertension Continue antihypertensive medications as already ordered, these medications have been reviewed and there are no changes at this time.  3. Controlled type 2 diabetes mellitus, without long-term current use of insulin (HCC) Continue hypoglycemic medications as already ordered, these medications have been reviewed and there are no changes at this time.  Hgb A1C to be monitored as already arranged by  primary service   Current Outpatient Medications on File Prior to Visit  Medication Sig Dispense Refill   azithromycin  (ZITHROMAX ) 250 MG tablet Take 1 a day for 4 days 4 each 0   Blood Glucose Monitoring Suppl (ACCU-CHEK GUIDE) w/Device KIT      Cholecalciferol (VITAMIN D3) 1.25 MG (50000 UT) CAPS Take 1 capsule by mouth once a week.     clindamycin (CLEOCIN) 300 MG capsule Take 300 mg by mouth 3 (three) times daily.     DULoxetine (CYMBALTA) 60 MG capsule Take 60 mg by mouth daily.     enalapril (VASOTEC) 5 MG tablet Take 5 mg by mouth daily.     furosemide  (LASIX ) 20 MG tablet Take 1 tablet (20 mg total) by mouth daily. 10 tablet 0   gabapentin (NEURONTIN) 600 MG tablet Take 600 mg by mouth 3 (three) times daily.     Multiple Vitamins-Minerals (PRESERVISION AREDS 2) CAPS Take by mouth.     mupirocin ointment (BACTROBAN) 2 % Apply topically 3 (three) times daily.     OZEMPIC, 0.25 OR 0.5 MG/DOSE, 2 MG/3ML SOPN Inject 0.5 mg into the skin once a week.     rosuvastatin (CRESTOR) 40 MG tablet Take 40 mg by mouth daily.     spironolactone (ALDACTONE) 25 MG tablet Take 1 tablet by mouth  daily.     STEQEYMA 45 MG/0.5ML SOSY Inject 45 mg into the skin.     metFORMIN (GLUCOPHAGE) 500 MG tablet Take by mouth 2 (two) times daily with a meal.     No current facility-administered medications on file prior to visit.    There are no Patient Instructions on file for this visit. No follow-ups on file.   Markeshia Giebel E Winna Golla, NP

## 2024-01-28 NOTE — Unmapped (Signed)
 [facility] Refill Coordination Note    Specialty Medication(s) to be Shipped:   Inflammatory Disorders: ustekinumab -stba: STEQEYMA  45 mg/0.5 mL Syrg    Other medication(s) to be shipped: No additional medications requested for fill at this time    Specialty Medications not needed at this time: N/A     Carol Caldwell, DOB: [birth year]  Phone: [phone] (home)       All above HIPAA information was verified with patient.     Was a Nurse, learning disability used for this call? No    Completed refill call assessment today to schedule patient's medication shipment from the [facility]  [phone]).  All relevant notes have been reviewed.     Specialty medication(s) and dose(s) confirmed: Regimen is correct and unchanged.   Changes to medications: Started Ozempic a month ago  Changes to insurance: No  New side effects reported not previously addressed with a pharmacist or physician: None reported  Questions for the pharmacist: No    Confirmed patient received a Conservation officer, historic buildings and a Surveyor, mining with first shipment. The patient will receive a drug information handout for each medication shipped and additional FDA Medication Guides as required.       DISEASE/MEDICATION-SPECIFIC INFORMATION        For patients on injectable medications: Next injection is scheduled for 10.24.25.    SPECIALTY MEDICATION ADHERENCE     Medication Adherence    Patient reported X missed doses in the last month: 0  Specialty Medication: ustekinumab -stba: STEQEYMA  45 mg/0.5 mL Syrg  Patient is on additional specialty medications: No              Were doses missed due to medication being on hold? No      ustekinumab -stba: STEQEYMA  45 mg/0.5 mL Syrg: 0 doses of medicine on hand       REFERRAL TO PHARMACIST     Referral to the pharmacist: Not needed      SHIPPING     Shipping address confirmed in Epic.     Cost and Payment: Patient has a $0 copay, payment information is not required.    Delivery Scheduled: Yes, Expected medication delivery date: 10.22.25.     Medication will be delivered via Same Day Courier to the prescription address in Epic WAM.    Doyal Hurst   [facility]  Specialty Technician

## 2024-02-03 MED FILL — STEQEYMA 45 MG/0.5 ML SUBCUTANEOUS SYRINGE: SUBCUTANEOUS | 56 days supply | Qty: 0.5 | Fill #2

## 2024-02-07 NOTE — Progress Notes (Signed)
 HISTORY OF PRESENT ILLNESS: Carol Caldwell is a 68 y.o. female with history of T2DM, HTN, psoriatic arthritis, who returns to clinic for follow up visit. Patient was last seen on 01/12/2024, at which time right great toe wound was debrided and a prescription was provided for [facility] shoe. Surgery was deferred for hallux limitus of right great toe. Chronic diabatic neuropathy was to be managed with custom diabatic shoes.     Today, patient reports to be doing well and has gotten evaluated for custom inserts and shoes. Patient claims to occasionally apply dry skin lotion on her peeling skin at the bottom of her feet.     Last A1c was 6.5% about three months ago per patient. Last A1c on record is 7.4% on 02/17/2023.     ROS: See HPI, otherwise 10 systems reviewed is negative.    RX/ ALLERGIES/ MED HX/SURG HX/ SOC HX/FAM HX: Reviewed & updated in Epic    PHYSICAL EXAM:   Consitutional: No acute distress, alert and oriented.    Neurologic:  Unchanged.    Vascular:   Unchanged.    Orthopedic:   Unchanged.     Dermatologic:   Plantar scaling and moccasin distribution of both feet with ring like pattern consistent with tinea pedis.     Post debridement measurement .3 x .3 x .1 cm    Wound 12/16/23 Toe (Comment which one) Right Great toe, plantar (Active)   Wound Image   02/09/24 0803   Wound Status Not Healed 01/12/24 1345   Pain 0 02/09/24 0803   Dressing Status      Removed 02/09/24 0803   Wound Length (cm) 0.2 cm 02/09/24 0811   Wound Width (cm) 0.3 cm 02/09/24 0811   Wound Depth (cm) 0.1 cm 02/09/24 0811   Wound Surface Area (cm^2) 0.05 cm^2 02/09/24 0811   Wound Volume (cm^3) 0.003 cm^3 02/09/24 0811   Wound Healing % -200 02/09/24 0811   Peri-wound Assessment      Callus;Edema 02/09/24 0803   Margins UTA 02/09/24 0803   Odor None 02/09/24 0803   Site Assessment UTA 02/09/24 0803   Texture Edema;Callus 02/09/24 0803   Moisture Dry/Scaly 02/09/24 0803   Temperature WNL 02/09/24 0803   Hypergranuation No 01/12/24 1345 Sinus Tract No 01/12/24 1345   Treatments Cleansed/Irrigation;Pharmaceutical agent 02/09/24 0803   Picture Taken Yes 02/09/24 0803   Dressing Other (Comment) 01/12/24 1403   Length (Post-Debridement) 0.3 cm 02/09/24 0811   Width (Post Debridement) 0.3 cm 02/09/24 0811   Depth (Post-Debridement) 0.1 cm 02/09/24 0811   Area(Post-Debridement) 0.09 sq cm 02/09/24 0811   Volume(Post-Debridement) 0.01 sq cm 02/09/24 0811             PROCEDURE:  11055 paring of benign lesion of peeling skin of left foot.     02402 - Selective debridement of 1st toe right.  Based on the necrotic tissue and biofilm present, the decision was made to debride 1st toe right locations. All benefits, risks, and alternatives were explained in detail to the patient. A time out was taken prior to the procedure. The area was prepped in standard clinic fashion. Selective debridement was performed to fully debride all devitalized and necrotic skin and fibrous tissue down to bleeding granulation tissue with the use of a curette, forceps and scalpel. Hemostasis was achieved with pressure and the measurements were recorded. Offloading was discussed. Local anetsheia achieved with lidocaine topical 4% for 15 minute prior to debridement.    Dressing: Acticoat  Offloading: DH Shoe     Dressing: Xeroform and gauze    Offloading: DH Shoe       LABS/IMAGING/Notes:   01/13/2024 Orthotist notes were reviewed.     Notes were independently reviewed and interpreted.     Assessment and Plan:     Chronic right great toe wound - Wound was debrided today. Patient is recommended to dress the wound with xeroform and gauze dressings. Patient was also advised to bandage during the day and leave the wound open at night. She is to protect the wound until she receives the shoe. Afterward, she is to break them in slowly. If the wound is healing well with the new shoes, patient can ambulate more. If there are any alarming or serious symptoms like purulence, patient is to visit the emergency room. Patient is to bring her custom shoes to her next visit.     Tinea pedis- Peeling skin was pared in clinic today. Patient was recommended to use both the prescribed ketoconazole and the dry skin lotion, applying one in the morning and the other at night.     Hypertension- Patient initially had elevated blood pressure without symptoms. Repeat measurement at the end of the visit was 154/80, lower than the initial value. The initial elevation may have been related to insufficient rest prior to assessment.    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  This is primarily functional.  Stable at today's visit.  She wishes to continue to avoid surgery at wishes to work with the orthopedic shoes.  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 Has been measured for shoes and will pick up in a few weeks- return after pick up for eval -notes reviewed from O and P    Chronic diabetic neuropathy - Patient was given instructions on diabetic foot care, including not going barefoot, checking water temperature with elbow, and frequent monitoring of feet for open sores or wounds.     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    RTC in a month.     ______________________________________________________________________    Documentation assistance was provided by Rathisree Seenivasan, Scribe, on February 09, 2024 at 8:22 AM for [name], DPM.    ----------------------------------------------------------------------------------------------------------------------  February 09, 2024 12:18 PM. Documentation assistance provided by the Scribe. I was present during the time the encounter was recorded. The information recorded by the Scribe was done at my direction and has been reviewed and validated by me.  ----------------------------------------------------------------------------------------------------------------------

## 2024-02-09 ENCOUNTER — Ambulatory Visit: Admit: 2024-02-09 | Discharge: 2024-02-09 | Payer: Medicare (Managed Care) | Attending: Podiatrist | Primary: Podiatrist

## 2024-02-09 DIAGNOSIS — I10 Essential (primary) hypertension: Principal | ICD-10-CM

## 2024-02-09 DIAGNOSIS — B353 Tinea pedis: Principal | ICD-10-CM

## 2024-02-09 DIAGNOSIS — L97511 Non-pressure chronic ulcer of other part of right foot limited to breakdown of skin: Principal | ICD-10-CM

## 2024-02-09 DIAGNOSIS — E1142 Type 2 diabetes mellitus with diabetic polyneuropathy: Principal | ICD-10-CM

## 2024-02-09 DIAGNOSIS — E11621 Type 2 diabetes mellitus with foot ulcer: Principal | ICD-10-CM

## 2024-02-09 MED ORDER — KETOCONAZOLE 2 % TOPICAL CREAM
Freq: Every day | TOPICAL | 0 refills | 30.00000 days | Status: CP
Start: 2024-02-09 — End: 2024-03-10

## 2024-02-09 NOTE — Patient Instructions (Addendum)
 WOUND CARE INSTRUCTIONS:        Wash area with wound cleanser or antibacterial soap and water ,  and moisturize the area without getting moisturizer on the wound bed.    Dressing: dry gauze and tape.  If the area starts to bleed again, add back the xeroform and gauze dressing.  Change daily    [name], DPM          If you have any questions or concerns regarding your wound or wound care, please contact us  at the [facility] Wound Healing and Podiatry clinic at [phone] or Vascular Surgery at [phone].   If your wound starts to develop the following , please call the [facility] for further advise:     Increased drainage   Redness around the wound   Strong odor from the wound when changing the bandages   Increased pain    Please do not hesitate to leave a voicemail on the nurse line. We make every effort to return your call the same day or the next day. Please leave a clear message with your name, date of birth,  and your medical record number. Leave a brief description of your problem. You may also send a picture through [facility] to help us  with medical decision making. [place] should not be used for urgent/emergent situations, but a picture of the wound and dressings removed are helpful.     If you are experiencing the following, please call us  for advise or consider going to the nearest local Emergency Department or call 911.     Fever of 100 F   Nausea or Vomitting   Pus draining from your wound   Redness of the whole foot or leg   Severe increase in pain above your baseline.    [facility]  [phone]  or  [facility] Vascular Surgery  [phone]

## 2024-03-02 ENCOUNTER — Ambulatory Visit: Admit: 2024-03-02 | Discharge: 2024-03-03 | Payer: Medicare (Managed Care)

## 2024-03-02 NOTE — Patient Instructions (Signed)
Foot Orthotics    Purpose/Function  Foot Orthoses are meant to accommodate the shape of your foot while providing layers of material that are meant to prevent sores from developing.  This material in foot orthotics is designed to compress in areas of high pressure over time.  The effect is that this process envelops the foot creating a total-contact effect with the orthoses.  This accommodates unique shapes of your foot to re-distribute pressure and avoid injury.  Extra depth shoes are designed to fit these insoles.  Some commercially available shoes will also fit insoles.      Putting device on / taking it off  Socks should always be worn with your shoes / insoles. These help prevent pressure build up and irritation.    Insoles should always remain in your shoes.  Do not leave your shoes without insoles in them.    Adding the orthotics to shoes  Loosen all laces or Velcro  Remove the stock insole if possible  If needed use the stock insole as a template to trim the foot orthotics  Slide insert into shoe.  Firmly press down to seat the orthotic in the heel.  Use fingers to make sure there are not wrinkles in the orthotic.    Slide foot into shoe.  Use a shoe horn if needed to seat heel nicely in the back of the shoe.    Use your finger to make sure that the heel and tongue are not folded over - smooth heel and tongue.    Secure laces or Velcro and walk around for a short while.    Adjust if shoes do not feel right.      To remove shoe:  Loosen laces or Velcro  Slide shoe off of foot.    Store shoes in clean, dry location with the Velcro fastened to avoid Velcro getting caught on other clothes or accumulating debris.      Wearing your device  You should break-in your footwear gradually:   We recommend the following schedule:     Day 1: Wear for 1 hour    Day 2:  Wear for 2 hours    Day 3: Wear for 3 hours, off for 1 hour.  Repeat as time allows    Day 4: Wear for 4 hours, off for 1 hour.  Repeat as time allows    Day 5 and beyond:   Wear full day  Even after you reach ???full time' wear - it is important to remove your shoes periodically and check your feet for irritation.  If you see consistent areas of redness or darkening of the skin, please contact your office to have an adjustment made.      Cleaning / Care / Infection Control  Foot orthoses can be washed with soap and water.  Use a mild cleaning detergent.  Do not use bleach.  Do not place in dryer - set out to air dry.      Risks  The most notable risk to you of wearing this is skin irritation.  There is a risk of over-use causing skin irritation or blistering.  Be sure to wear a comfortable sock.  It is critical to break-in your orthoses by wearing it for short periods of time and checking your skin for any signs of irritation.    While some redness is expected, it should go away within 20-30 minutes.  If you experience any blisters or signs of rash discontinue wearing and contact  your provider.    Does not contain latex    How to contact  If you experience any discomfort or have questions about the use of this orthosis you may contact the [facility]ME[facility] team by using the inbasket feature of your mychart account.  You may also contact your provider in the same way.  If you would like to reach out by phone, the number is [phone].

## 2024-03-02 NOTE — Progress Notes (Signed)
 [facility] [place]  Prosthetics & Orthotics  [phone]     Lower Extremity Orthotics Delivery      HPI   SUBJECTIVE:   Carol Caldwell is a 68 y.o. female who presents for delivery of Diabetic Inserts and diabetic Shoes.      History     Changes since last visit:  None stated    Actions taken today       Device Data    Base Device  Quantity: 6  Side: Bilateral  Size: Custom  Description: Custom Diabteitc inserts  Part Number: [id number]  Supplier: [employer]  Warranty: 90 Days  Action: Delivered    Foot or Hand  Quantity: 2  Side: Bilateral  Size: other (9 Wide)  Description: NO. 51 CASUAL DRESS-VELCRO BLACK 9 WIDE  Part Number: [id number]  Supplier: [employer]  Warranty: 90 Days  Action: Delivered        Modifications made today:   Size  []     Alignment []     Other [x]   Bilateral Off load 1st phalanx with donut reliefs and back filled with Poron            Goals     Short Term:  Redistribute pressure evenly on feet  Relieve callus or ulcer areas  Improve alignment of the hind foot and forefoot  Accommodate unique alignment of the foot/ankle  Long Term:  Heal ulcer sites  Prevent future skin breakdown  Wear device full time while walking  Prevent progression of deformities  Patient Goals:  Prevent skin breakdown or irritation  Improve foot comfort with appropriate fitting devices  Increase activity level      Assessment   Diabetic insert was placed on the planar aspect of patients foot to check for location of patient's ulcers. Fos were offloaded bilateral with donut reliefs and back filled with poron. Fos were placed in the shoes and the patient put the shoes on. Patient did not state any discomfort at this time.     Safety Check:   x Foot orthoses were checked for safety / security   x Checked for loose / sharp components    Counseled patient about wear and care, in particular we reviewed:     [x]  Device purpose   [x]  How to properly put on / take off  [x]  Proper wear and care   [x]  Cleaning instructions   [x]  Safety precautions and risks   [x]  How to report problems          Plan    Return Plan:  Follow up as needed

## 2024-03-13 NOTE — Progress Notes (Deleted)
 HISTORY OF PRESENT ILLNESS: Carol Caldwell is a 68 y.o. female with history of T2DM, HTN, psoriatic arthritis, who returns to clinic for follow up visit. Patient was last seen on 02/09/2024, at which time chronic right great toe wound was debrided and patient was to dress the wound with xeroform and gauze dressings. She was recommended to slowly break into her custom shoes. Patient was to use both ketoconazole  and dry skin lotion for tinea pedis for which peeling skin was pared in clinic.     Today, patient reports ***    Last A1c was 6.5% about four months ago per patient. Last A1c on record is 7.4% on 02/17/2023.     ROS: See HPI, otherwise 10 systems reviewed is negative.    RX/ ALLERGIES/ MED HX/SURG HX/ SOC HX/FAM HX: Reviewed & updated in Epic    PHYSICAL EXAM:   Consitutional: No acute distress, alert and oriented.    Neurologic:  Unchanged.    Vascular:   Unchanged.    Orthopedic:   Unchanged.     Dermatologic:   Post debridement measurement ***    *** wound chart         PROCEDURE:  *** Last visit  11055 paring of benign lesion of peeling skin of left foot.     02402 - Selective debridement of 1st toe right.  Based on the necrotic tissue and biofilm present, the decision was made to debride 1st toe right locations. All benefits, risks, and alternatives were explained in detail to the patient. A time out was taken prior to the procedure. The area was prepped in standard clinic fashion. Selective debridement was performed to fully debride all devitalized and necrotic skin and fibrous tissue down to bleeding granulation tissue with the use of a curette, forceps and scalpel. Hemostasis was achieved with pressure and the measurements were recorded. Offloading was discussed. Local anetsheia achieved with lidocaine topical 4% for 15 minute prior to debridement.    Dressing: Xeroform and gauze    Offloading: DH Shoe       LABS/IMAGING/Notes:   None.    Assessment and Plan:     Chronic right great toe wound - Wound was debrided today. Patient is recommended to dress the wound with xeroform and gauze dressings. Patient was also advised to bandage during the day and leave the wound open at night. She is to protect the wound until she receives the shoe. Afterward, she is to break them in slowly. If the wound is healing well with the new shoes, patient can ambulate more. If there are any alarming or serious symptoms like purulence, patient is to visit the emergency room. Patient is to bring her custom shoes to her next visit.     Tinea pedis- Peeling skin was pared in clinic today. Patient was recommended to use both the prescribed ketoconazole  and the dry skin lotion, applying one in the morning and the other at night.     Hypertension- Patient initially had elevated blood pressure without symptoms. Repeat measurement at the end of the visit was 154/80, lower than the initial value. The initial elevation may have been related to insufficient rest prior to assessment.    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  This is primarily functional.  Stable at today's visit.  She wishes to continue to avoid surgery at wishes to work with the orthopedic shoes.  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 Has been measured for shoes and will pick  up in a few weeks- return after pick up for eval -notes reviewed from O and P    Chronic diabetic neuropathy - Patient was given instructions on diabetic foot care, including not going barefoot, checking water temperature with elbow, and frequent monitoring of feet for open sores or wounds.     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    RTC ***    ***

## 2024-03-14 NOTE — Progress Notes (Unsigned)
 HISTORY OF PRESENT ILLNESS: Carol Caldwell is a 68 y.o. female with history of T2DM, HTN, psoriatic arthritis, who returns to clinic for follow up visit. Patient was last seen on 02/09/2024, at which time chronic right great toe wound was debrided and patient was to dress the wound with xeroform and gauze dressings. She was recommended to slowly break into her custom shoes. Patient was to use both ketoconazole  and dry skin lotion for tinea pedis for which peeling skin was pared in clinic.     Today, patient reports ***    Last A1c was 6.5% about four months ago per patient. Last A1c on record is 7.4% on 02/17/2023.     ROS: See HPI, otherwise 10 systems reviewed is negative.    RX/ ALLERGIES/ MED HX/SURG HX/ SOC HX/FAM HX: Reviewed & updated in Epic    PHYSICAL EXAM:   Consitutional: No acute distress, alert and oriented.    Neurologic:  Unchanged.    Vascular:   Unchanged.    Orthopedic:   Unchanged.     Dermatologic:   Post debridement measurement ***    *** wound chart         PROCEDURE:  *** Last visit  11055 paring of benign lesion of peeling skin of left foot.     02402 - Selective debridement of 1st toe right.  Based on the necrotic tissue and biofilm present, the decision was made to debride 1st toe right locations. All benefits, risks, and alternatives were explained in detail to the patient. A time out was taken prior to the procedure. The area was prepped in standard clinic fashion. Selective debridement was performed to fully debride all devitalized and necrotic skin and fibrous tissue down to bleeding granulation tissue with the use of a curette, forceps and scalpel. Hemostasis was achieved with pressure and the measurements were recorded. Offloading was discussed. Local anetsheia achieved with lidocaine topical 4% for 15 minute prior to debridement.    Dressing: Xeroform and gauze    Offloading: DH Shoe       LABS/IMAGING/Notes:   None.    Assessment and Plan:     Chronic right great toe wound - Wound was debrided today. Patient is recommended to dress the wound with xeroform and gauze dressings. Patient was also advised to bandage during the day and leave the wound open at night. She is to protect the wound until she receives the shoe. Afterward, she is to break them in slowly. If the wound is healing well with the new shoes, patient can ambulate more. If there are any alarming or serious symptoms like purulence, patient is to visit the emergency room. Patient is to bring her custom shoes to her next visit.     Tinea pedis- Peeling skin was pared in clinic today. Patient was recommended to use both the prescribed ketoconazole  and the dry skin lotion, applying one in the morning and the other at night.     Hypertension- Patient initially had elevated blood pressure without symptoms. Repeat measurement at the end of the visit was 154/80, lower than the initial value. The initial elevation may have been related to insufficient rest prior to assessment.    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  This is primarily functional.  Stable at today's visit.  She wishes to continue to avoid surgery at wishes to work with the orthopedic shoes.  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 Has been measured for shoes and will pick  up in a few weeks- return after pick up for eval -notes reviewed from O and P    Chronic diabetic neuropathy - Patient was given instructions on diabetic foot care, including not going barefoot, checking water temperature with elbow, and frequent monitoring of feet for open sores or wounds.     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    RTC ***    ***

## 2024-03-15 ENCOUNTER — Ambulatory Visit: Admit: 2024-03-15 | Payer: Medicare (Managed Care) | Attending: Podiatrist | Primary: Podiatrist

## 2024-03-18 ENCOUNTER — Inpatient Hospital Stay: Admit: 2024-03-18 | Discharge: 2024-03-19 | Payer: Medicare (Managed Care)

## 2024-03-18 ENCOUNTER — Ambulatory Visit: Admit: 2024-03-18 | Discharge: 2024-03-19 | Payer: Medicare (Managed Care) | Attending: Family | Primary: Family

## 2024-03-18 DIAGNOSIS — M1711 Unilateral primary osteoarthritis, right knee: Principal | ICD-10-CM

## 2024-03-18 DIAGNOSIS — M25561 Pain in right knee: Principal | ICD-10-CM

## 2024-03-18 NOTE — Progress Notes (Signed)
 SPORTS MEDICINE NEW VISIT    ASSESSMENT AND PLAN      Diagnosis ICD-10-CM Associated Orders   1. Arthritis of right knee  M17.11 Orthopedics DME Order      2. Right knee pain, unspecified chronicity  M25.561 XR Knee 4 Or More Views Right             Assessment & Plan  Right knee osteoarthritis  -Chronic right knee pain with radiographic mild arthritis.    - Discussed the diagnosis of osteoarthritis and options for treatment  including steroid injections, hyaluronic acid gel injections, knee braces, physical therapy, and topical NSAIDs. Explained risks of steroid injections and potential need for prior authorization for hyaluronic acid gel injections.  - Fitted for a knee brace for stability during ambulation.  - Recommended use of Voltaren  gel on the knee.  - Provided exercises to strengthen knee muscles.  - Injection deferred at this time.  Will consider hyaluronic acid gel injections if other measures are insufficient, requiring insurance authorization.    Return if symptoms worsen or fail to improve.    Procedure(s):  none      SUBJECTIVE     Chief Complaint: No chief complaint on file.      68 y.o. female     History of Present Illness  Carol Caldwell is a 68 year old female with psoriatic arthritis who presents with right knee pain.    She has experienced right knee pain for a few years, describing it as intermittent and diffuse across the knee. The pain sometimes causes the backside of her leg to feel like it might give out or buckle, particularly when walking. No left knee pain.    She has not received any treatment for her knee pain in the past but uses gabapentin  for pain management, which she finds helpful. No noticeable swelling, popping, clicking, or cracking in the knee. The pain can be severe enough to wake her at night.    She has diabetes and her last A1c was 7.5.      Past Medical History: Past Medical History[1]      OBJECTIVE     Physical Exam:  Vitals:   Wt Readings from Last 3 Encounters: 10/22/23 76.3 kg (168 lb 3.2 oz)   09/23/23 79.1 kg (174 lb 4.4 oz)   02/17/23 77.9 kg (171 lb 12.8 oz)     Estimated body mass index is 31.78 kg/m?? as calculated from the following:    Height as of 02/17/23: 154.9 cm (5' 1).    Weight as of 10/22/23: 76.3 kg (168 lb 3.2 oz).  Gen: Well-appearing female in no acute distress  MSK: Right knee with mild effusion.  Medial and lateral joint line tenderness palpation.  Range of motion 0 to 120 degrees.  Stable to ligamentous exam.  Pain with [place].  Skin is warm, dry and intact without excessive warmth, edema or ecchymosis.  Physical Exam        Imaging/other tests: R knee x-rays taken today and reviewed with patient in exam room today show:  Right:No acute fracture or dislocation. Minimal medial tibiofemoral joint space narrowing with few marginal osteophytes. No joint effusion. No focal soft tissue swelling.   Partially imaged left knee demonstrate no acute fracture.     Results      @SPORTSPROMIS @      ADMINISTRATIVE     I have personally reviewed and interpreted the images (as available).  Point-of-care ultrasound imaging is on file and stored  in a permanent location (if performed).  I have personally reviewed prior records and incorporated relevant information above (as available).    Note written with [employer].      @SMIBILLING @    PROCEDURES     Procedures     DME     DME ORDER:  Dx:  ,    Laterality: Right  Body Location: Hip and Knee  Hip and Knee Orthotics: Neoprene Hinged  Weightbearing: Full                          [1]   Past Medical History:  Diagnosis Date    Diabetes mellitus (CMS-HCC)     Psoriasis     Psoriatic arthritis    (CMS-HCC)

## 2024-03-18 NOTE — Patient Instructions (Signed)
 Plan: Try knee brace with activity as needed.  Work on exercises as tolerated with limitation for pain.  Ice or heat and over-the-counter Tylenol or topical Voltaren  gel as needed for pain.  Please contact me with any questions or concerns.      Thank you for coming to [facility] and our clinic today!     We aim to provide you with the highest quality, individualized care.  If you have any unanswered questions after the visit, please do not hesitate to reach out to us  on MyChart or leave a message for the nurse.  ?  MyChart messages: These messages can be sent to your provider and will be checked by their clinical support staff.? The messages are checked throughout the day during normal business hours from 8:30 am-4:00 pm Monday-Friday, however responses may take up to 48 hours.? Please use this method of communication for non-urgent and non-emergent concerns, questions, refill requests or inquiries only.? ?Our team will help respond to all of your questions.? Please note that you may be asked to see a provider by either a telehealth or in person visit if it is deemed your questions are best handled in the clinic setting in person.??  ?  Please keep in mind, these messages are not real time communications, so be patient when waiting for a response.    If you do not have access to MyChart, do not know how to use MyChart or have an issue that may require more extensive discussion, please call the nurses' call line: [phone].? This line is checked throughout the day and will be responded to as time allows.? Please note that return calls could take up to 48 hours, depending on the nature of the need.?  ?  If you have an issue that requires emergent attention that cannot wait; either call the Orthopaedics resident on call at [phone], consider coming to our [facility] walk-in clinic, or go to the nearest Emergency Department.    If you need to schedule future appointments, please call [phone].     We look forward to seeing you again in the future and appreciate you choosing [facility] for your care!    Thank you,                We provide innovative and comprehensive patient centered care that is supported by evidence-based research                                                                                                    RESEARCH PARTICIPATION    Please check out our current research studies to see if you or someone you know may qualify at:    [email or web address]

## 2024-03-23 NOTE — Progress Notes (Unsigned)
 HISTORY OF PRESENT ILLNESS: Carol Caldwell is a 68 y.o. female with history of T2DM, HTN, psoriatic arthritis, who returns to clinic for follow up visit. Patient was last seen on 02/09/2024, at which time chronic right great toe wound was debrided and patient was to dress the wound with xeroform and gauze dressings. She was recommended to slowly break into her custom shoes. Patient was to use both ketoconazole  and dry skin lotion for tinea pedis for which peeling skin was pared in clinic.     Today, patient reports ***    Last A1c was 6.5% about four months ago per patient. Last A1c on record is 7.4% on 02/17/2023.     ROS: See HPI, otherwise 10 systems reviewed is negative.    RX/ ALLERGIES/ MED HX/SURG HX/ SOC HX/FAM HX: Reviewed & updated in Epic    PHYSICAL EXAM:   Consitutional: No acute distress, alert and oriented.    Neurologic:  Unchanged.    Vascular:   Unchanged.    Orthopedic:   Unchanged.     Dermatologic:   Post debridement measurement ***    *** wound chart         PROCEDURE:  *** Last visit  11055 paring of benign lesion of peeling skin of left foot.     02402 - Selective debridement of 1st toe right.  Based on the necrotic tissue and biofilm present, the decision was made to debride 1st toe right locations. All benefits, risks, and alternatives were explained in detail to the patient. A time out was taken prior to the procedure. The area was prepped in standard clinic fashion. Selective debridement was performed to fully debride all devitalized and necrotic skin and fibrous tissue down to bleeding granulation tissue with the use of a curette, forceps and scalpel. Hemostasis was achieved with pressure and the measurements were recorded. Offloading was discussed. Local anetsheia achieved with lidocaine topical 4% for 15 minute prior to debridement.    Dressing: Xeroform and gauze    Offloading: DH Shoe       LABS/IMAGING/Notes:   None.    Assessment and Plan:     Chronic right great toe wound - Wound was debrided today. Patient is recommended to dress the wound with xeroform and gauze dressings. Patient was also advised to bandage during the day and leave the wound open at night. She is to protect the wound until she receives the shoe. Afterward, she is to break them in slowly. If the wound is healing well with the new shoes, patient can ambulate more. If there are any alarming or serious symptoms like purulence, patient is to visit the emergency room. Patient is to bring her custom shoes to her next visit.     Tinea pedis- Peeling skin was pared in clinic today. Patient was recommended to use both the prescribed ketoconazole  and the dry skin lotion, applying one in the morning and the other at night.     Hypertension- Patient initially had elevated blood pressure without symptoms. Repeat measurement at the end of the visit was 154/80, lower than the initial value. The initial elevation may have been related to insufficient rest prior to assessment.    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  This is primarily functional.  Stable at today's visit.  She wishes to continue to avoid surgery at wishes to work with the orthopedic shoes.  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 Has been measured for shoes and will pick  up in a few weeks- return after pick up for eval -notes reviewed from O and P    Chronic diabetic neuropathy - Patient was given instructions on diabetic foot care, including not going barefoot, checking water temperature with elbow, and frequent monitoring of feet for open sores or wounds.     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    RTC ***    ***

## 2024-03-23 NOTE — Progress Notes (Signed)
 [name] Refill Coordination Note    Specialty Medication(s) to be Shipped:   STEQEYMA  45 mg/0.5 mL Syrg (ustekinumab -stba)    Other medication(s) to be shipped: No additional medications requested for fill at this time    Specialty Medications not needed at this time: N/A     Carol Caldwell, DOB: [birth year]  Phone: [phone] (home)       All above HIPAA information was verified with patient.     Was a nurse, learning disability used for this call? No    Completed refill call assessment today to schedule patient's medication shipment from the [facility]  [phone]).  All relevant notes have been reviewed.     Specialty medication(s) and dose(s) confirmed: Regimen is correct and unchanged.   Changes to medications: Carol Caldwell reports no changes at this time.  Changes to insurance: No  New side effects reported not previously addressed with a pharmacist or physician: None reported  Questions for the pharmacist: No    Confirmed patient received a Conservation Officer, Historic Buildings and a Surveyor, Mining with first shipment. The patient will receive a drug information handout for each medication shipped and additional FDA Medication Guides as required.       DISEASE/MEDICATION-SPECIFIC INFORMATION        For patients on injectable medications: Next injection is scheduled for 12.19.25.    SPECIALTY MEDICATION ADHERENCE     Medication Adherence    Patient reported X missed doses in the last month: 0  Specialty Medication: STEQEYMA  45 mg/0.5 mL Syrg (ustekinumab -stba)  Patient is on additional specialty medications: No              Were doses missed due to medication being on hold? No      STEQEYMA  45 mg/0.5 mL Syrg (ustekinumab -stba): 0 doses of medicine on hand       REFERRAL TO PHARMACIST     Referral to the pharmacist: Not needed      [facility]     Shipping address confirmed in Epic.     Cost and Payment: Patient has a $0 copay, payment information is not required.    Delivery Scheduled: Yes, Expected medication delivery date: 12.17.25.     Medication will be delivered via Next Day Courier to the prescription address in Epic WAM.    Doyal Hurst   [facility]  Specialty Technician

## 2024-03-24 ENCOUNTER — Ambulatory Visit: Admit: 2024-03-24 | Payer: Medicare (Managed Care) | Attending: Podiatrist | Primary: Podiatrist

## 2024-03-29 MED FILL — STEQEYMA 45 MG/0.5 ML SUBCUTANEOUS SYRINGE: SUBCUTANEOUS | 56 days supply | Qty: 0.5 | Fill #3
